# Patient Record
Sex: Female | Born: 1975 | Race: White | Hispanic: No | Smoking: Former smoker
Health system: Southern US, Community
[De-identification: ages and names within clinical notes are randomized; demographics above are authoritative.]

## PROBLEM LIST (undated history)

## (undated) DIAGNOSIS — K8020 Calculus of gallbladder without cholecystitis without obstruction: Secondary | ICD-10-CM

## (undated) DIAGNOSIS — C801 Malignant (primary) neoplasm, unspecified: Secondary | ICD-10-CM

## (undated) DIAGNOSIS — M1990 Unspecified osteoarthritis, unspecified site: Secondary | ICD-10-CM

## (undated) DIAGNOSIS — J45909 Unspecified asthma, uncomplicated: Secondary | ICD-10-CM

## (undated) DIAGNOSIS — K589 Irritable bowel syndrome without diarrhea: Secondary | ICD-10-CM

## (undated) DIAGNOSIS — S92909A Unspecified fracture of unspecified foot, initial encounter for closed fracture: Secondary | ICD-10-CM

## (undated) DIAGNOSIS — D6851 Activated protein C resistance: Secondary | ICD-10-CM

## (undated) DIAGNOSIS — D689 Coagulation defect, unspecified: Secondary | ICD-10-CM

## (undated) DIAGNOSIS — E669 Obesity, unspecified: Secondary | ICD-10-CM

## (undated) HISTORY — DX: Unspecified fracture of unspecified foot, initial encounter for closed fracture: S92.909A

## (undated) HISTORY — DX: Calculus of gallbladder without cholecystitis without obstruction: K80.20

## (undated) HISTORY — PX: TONSILLECTOMY: SUR1361

## (undated) HISTORY — DX: Coagulation defect, unspecified: D68.9

## (undated) HISTORY — DX: Unspecified osteoarthritis, unspecified site: M19.90

## (undated) HISTORY — DX: Activated protein C resistance: D68.51

## (undated) HISTORY — DX: Unspecified asthma, uncomplicated: J45.909

## (undated) HISTORY — DX: Irritable bowel syndrome, unspecified: K58.9

## (undated) HISTORY — PX: CHOLECYSTECTOMY: SHX55

## (undated) HISTORY — DX: Obesity, unspecified: E66.9

## (undated) HISTORY — DX: Malignant (primary) neoplasm, unspecified: C80.1

---

## 2007-01-22 ENCOUNTER — Observation Stay: Payer: Self-pay | Admitting: Vascular Surgery

## 2008-05-02 IMAGING — CT CT ABD-PELV W/ CM
1 of 2 series · 16 of 32 positions shown, 20 images · non-contrast
Comparison: none

REASON FOR EXAM: RLQ pain R/O appendicitis
COMMENTS:

PROCEDURE:     CT  - CT ABDOMEN / PELVIS  W  - January 22, 2007  [DATE]
RESULT:     Abdominopelvic scan was performed for RIGHT lower quadrant pain.
 Axial images were obtained status post intravenous and oral administration
of contrast material. The report was faxed to the Emergency Room.

[Series 2: appendicitis · axial · 0.79mm/px · z∈[-290,+166]mm · 16 of 166 slices shown, 20 images]
[im 7/166  soft-tissue]
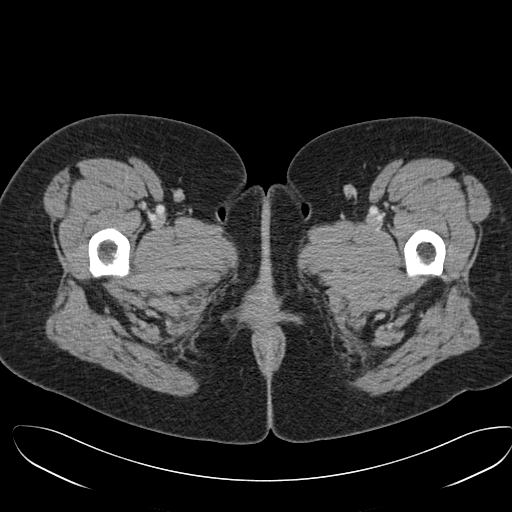
[im 7/166  bone]
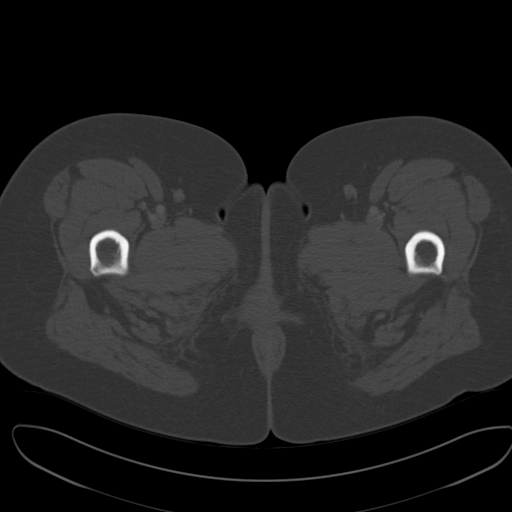
[im 21/166  soft-tissue]
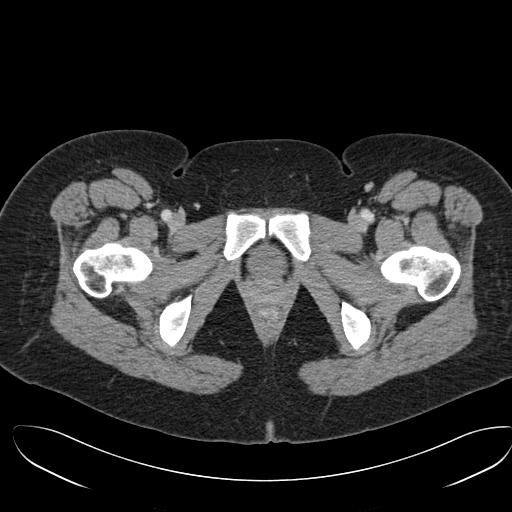
[im 35/166  soft-tissue]
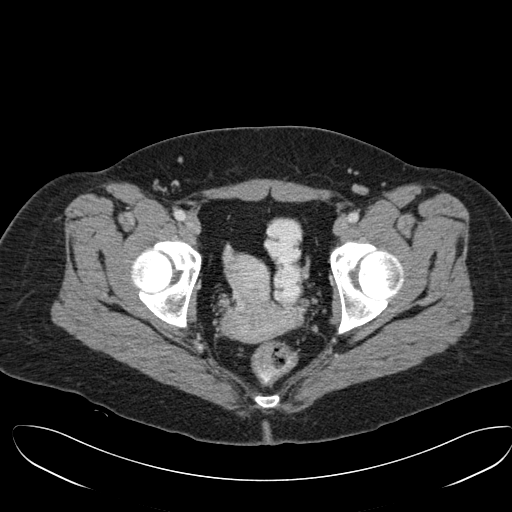
[im 42/166  soft-tissue]
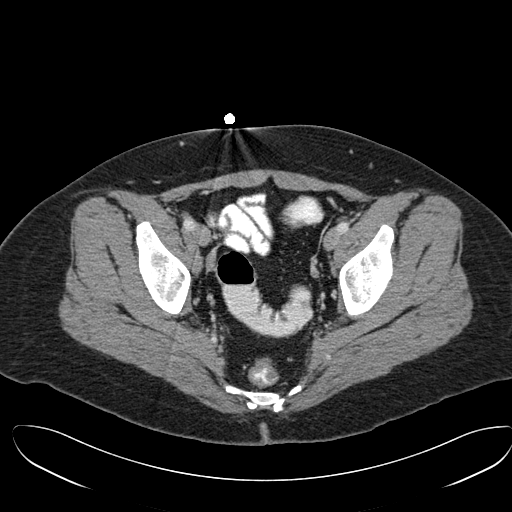
[im 56/166  soft-tissue]
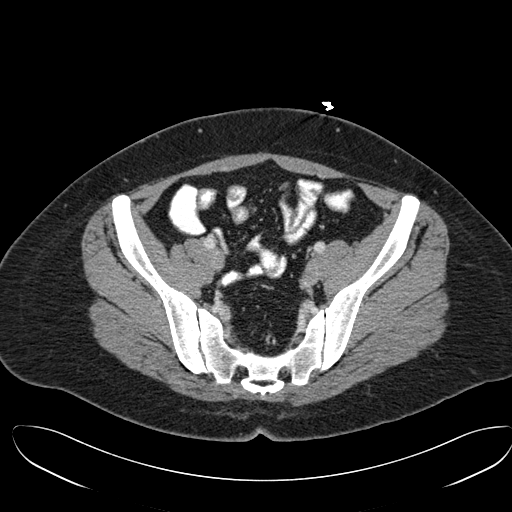
[im 69/166  soft-tissue]
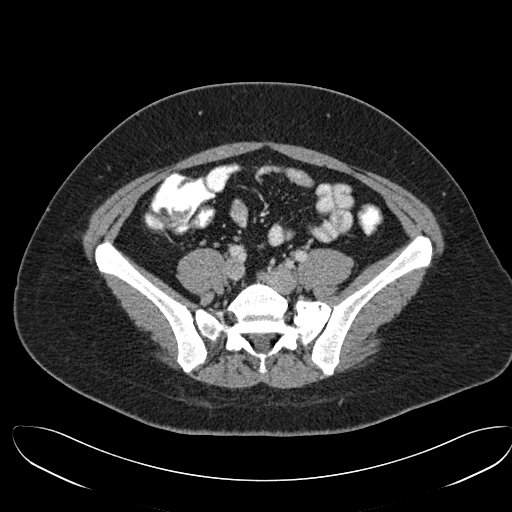
[im 76/166  soft-tissue]
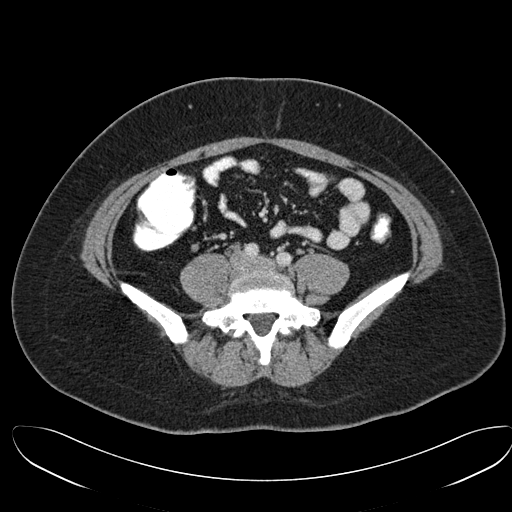
[im 90/166  soft-tissue]
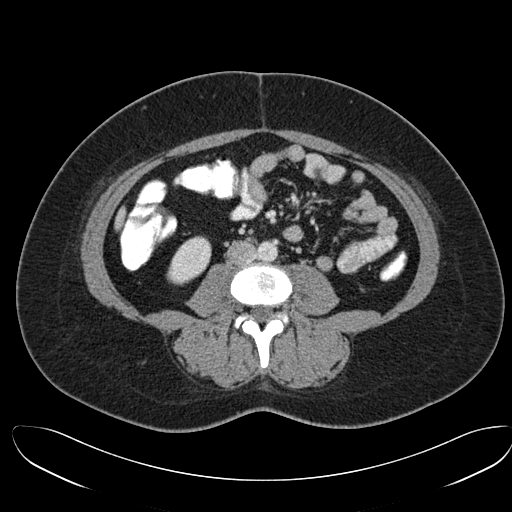
[im 97/166  soft-tissue]
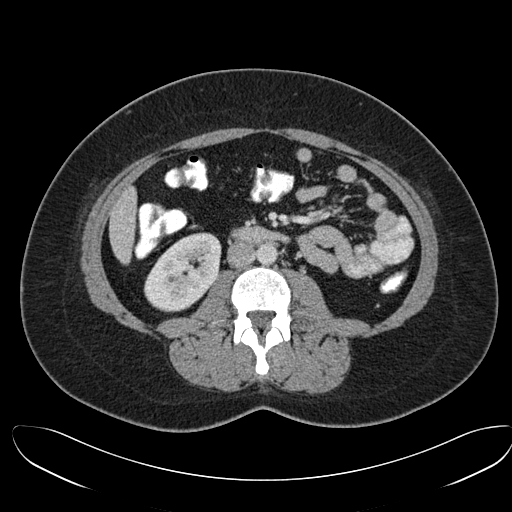
[im 97/166  bone]
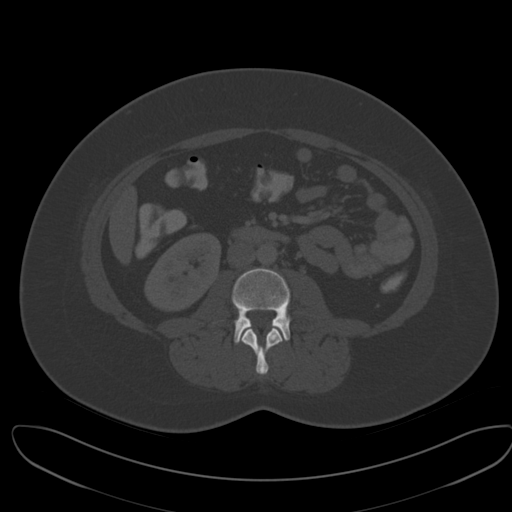
[im 111/166  soft-tissue]
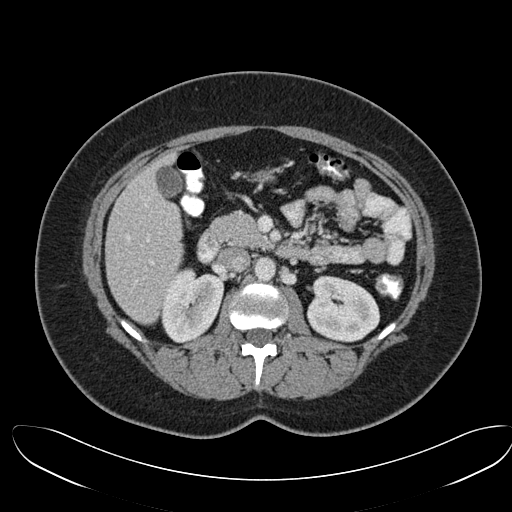
[im 124/166  soft-tissue]
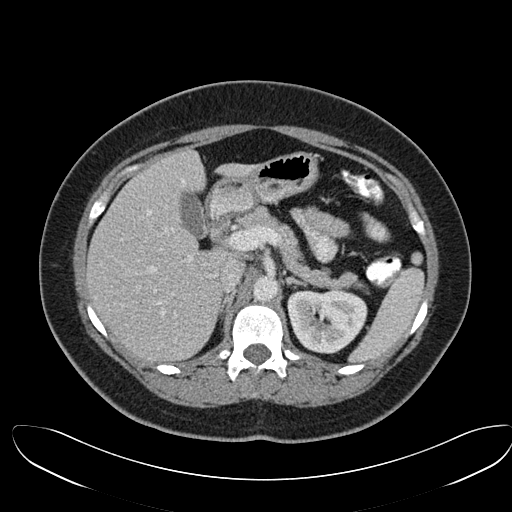
[im 131/166  soft-tissue]
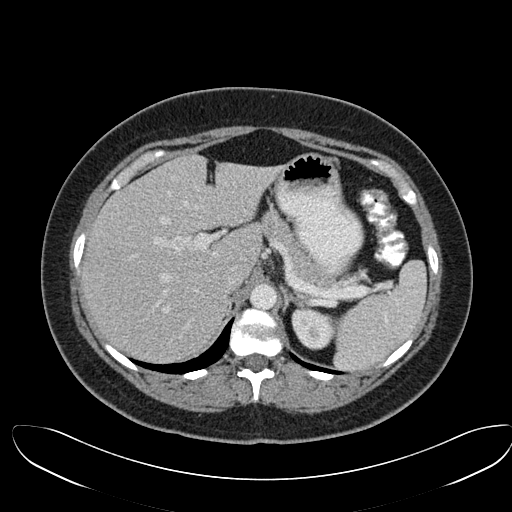
[im 138/166  lung]
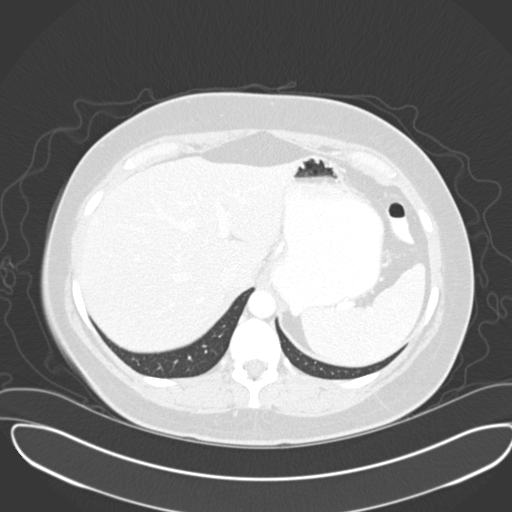
[im 145/166  soft-tissue]
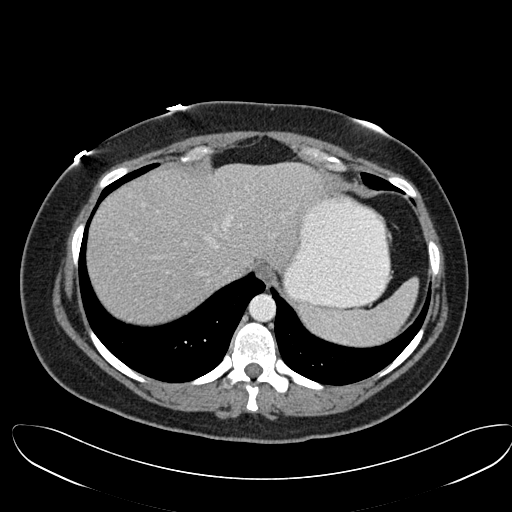
[im 145/166  lung]
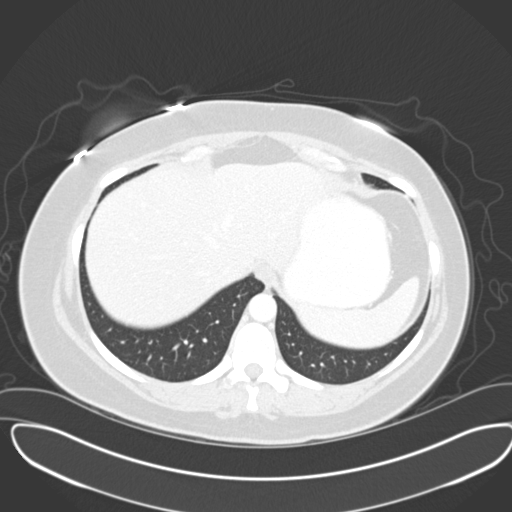
[im 152/166  lung]
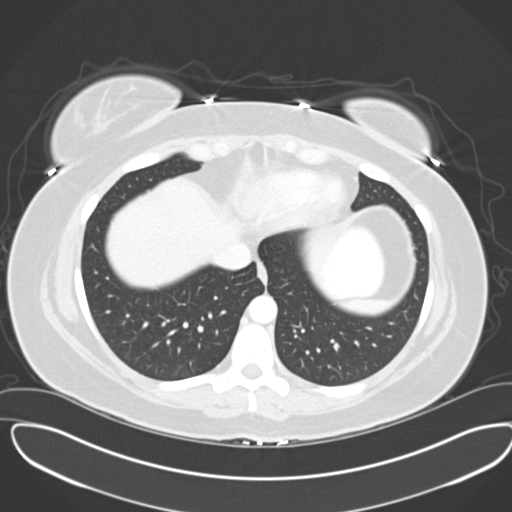
[im 159/166  soft-tissue]
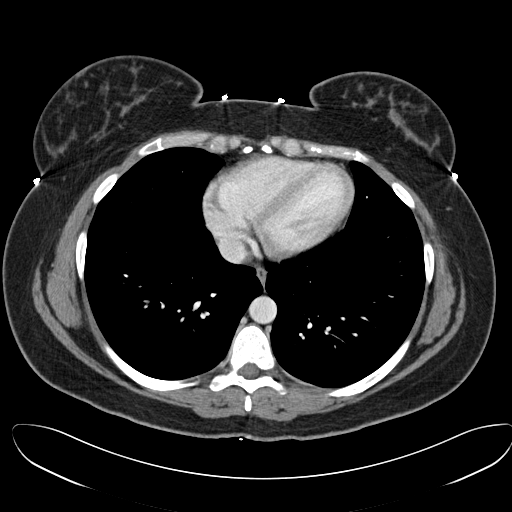
[im 159/166  lung]
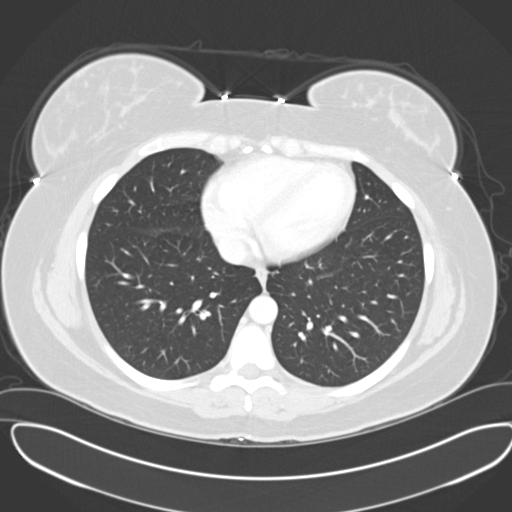

[16 of 32 positions shown; findings below may reference images not displayed]

FINDINGS: The liver and spleen appear intact. No ascites is seen. No
evidence of appendicitis is noted. No major organ abnormalities are noted.
The images through the lung bases reveal the lung bases to be clear with no
effusions.
IMPRESSION: 1)No definite evidence of appendicitis. No significant abnormalities are
noted on the abdominopelvic CT.

## 2010-02-04 ENCOUNTER — Encounter (INDEPENDENT_AMBULATORY_CARE_PROVIDER_SITE_OTHER): Payer: Self-pay | Admitting: *Deleted

## 2010-02-08 ENCOUNTER — Ambulatory Visit: Payer: Self-pay | Admitting: Internal Medicine

## 2010-02-17 ENCOUNTER — Ambulatory Visit: Payer: Self-pay | Admitting: General Surgery

## 2010-02-23 ENCOUNTER — Ambulatory Visit: Payer: Self-pay | Admitting: General Surgery

## 2010-11-07 ENCOUNTER — Encounter: Payer: Self-pay | Admitting: Maternal and Fetal Medicine

## 2010-11-24 ENCOUNTER — Encounter: Payer: Self-pay | Admitting: Maternal & Fetal Medicine

## 2011-01-17 NOTE — Letter (Signed)
Summary: New Patient letter  [facility]  [address] [place], [place] [address]   Phone: [phone]  Fax: [phone]       02/04/2010 MRN: [id number]  [facility] [address] [place], [place]  [address]  Dear Ms. Laurich,  Welcome to the Gastroenterology Division at [facility].    You are scheduled to see Dr. Arlyce Dice on 03-07-10 at 8:30a.m. on the 3rd floor at [facility], [address]  We ask that you try to arrive at our office 15 minutes prior to your appointment time to allow for check-in.  We would like you to complete the enclosed self-administered evaluation form prior to your visit and bring it with you on the day of your appointment.  We will review it with you.  Also, please bring a complete list of all your medications or, if you prefer, bring the medication bottles and we will list them.  Please bring your insurance card so that we may make a copy of it.  If your insurance requires a referral to see a specialist, please bring your referral form from your primary care physician.  Co-payments are due at the time of your visit and may be paid by cash, check or credit card.     Your office visit will consist of a consult with your physician (includes a physical exam), any laboratory testing he/she may order, scheduling of any necessary diagnostic testing (e.g. x-ray, ultrasound, CT-scan), and scheduling of a procedure (e.g. Endoscopy, Colonoscopy) if required.  Please allow enough time on your schedule to allow for any/all of these possibilities.    If you cannot keep your appointment, please call [phone] to cancel or reschedule prior to your appointment date.  This allows [place] the opportunity to schedule an appointment for another patient in need of care.  If you do not cancel or reschedule by 5 p.m. the business day prior to your appointment date, you will be charged a $50.00 late cancellation/no-show fee.    Thank you for choosing [facility] for your medical needs.  We appreciate the opportunity to care for you.  Please visit [place] at our website  to learn more about our practice.                     Sincerely,                                                             The Gastroenterology Division

## 2012-07-18 ENCOUNTER — Encounter: Payer: Self-pay | Admitting: Gastroenterology

## 2012-09-02 ENCOUNTER — Ambulatory Visit (INDEPENDENT_AMBULATORY_CARE_PROVIDER_SITE_OTHER): Payer: BC Managed Care – PPO | Admitting: Gastroenterology

## 2012-09-02 ENCOUNTER — Other Ambulatory Visit: Payer: Self-pay

## 2012-09-02 ENCOUNTER — Encounter: Payer: Self-pay | Admitting: Gastroenterology

## 2012-09-02 ENCOUNTER — Other Ambulatory Visit (INDEPENDENT_AMBULATORY_CARE_PROVIDER_SITE_OTHER): Payer: BC Managed Care – PPO

## 2012-09-02 VITALS — BP 110/80 | HR 72 | Ht 69.0 in | Wt 232.0 lb

## 2012-09-02 DIAGNOSIS — Z800 Family history of malignant neoplasm of digestive organs: Secondary | ICD-10-CM

## 2012-09-02 DIAGNOSIS — K625 Hemorrhage of anus and rectum: Secondary | ICD-10-CM

## 2012-09-02 DIAGNOSIS — R197 Diarrhea, unspecified: Secondary | ICD-10-CM

## 2012-09-02 LAB — CBC WITH DIFFERENTIAL/PLATELET
Basophils Absolute: 0 10*3/uL (ref 0.0–0.1)
Basophils Relative: 0.5 % (ref 0.0–3.0)
Eosinophils Absolute: 0.1 10*3/uL (ref 0.0–0.7)
HCT: 38.8 % (ref 36.0–46.0)
Lymphocytes Relative: 27.2 % (ref 12.0–46.0)
MCHC: 32.4 g/dL (ref 30.0–36.0)
Monocytes Relative: 7.9 % (ref 3.0–12.0)
Neutro Abs: 4.8 10*3/uL (ref 1.4–7.7)
Neutrophils Relative %: 63.7 % (ref 43.0–77.0)
RDW: 13.5 % (ref 11.5–14.6)
WBC: 7.5 10*3/uL (ref 4.5–10.5)

## 2012-09-02 LAB — COMPREHENSIVE METABOLIC PANEL
ALT: 15 U/L (ref 0–35)
Alkaline Phosphatase: 55 U/L (ref 39–117)
Glucose, Bld: 103 mg/dL — ABNORMAL HIGH (ref 70–99)
Sodium: 141 mEq/L (ref 135–145)
Total Bilirubin: 0.6 mg/dL (ref 0.3–1.2)
Total Protein: 7.3 g/dL (ref 6.0–8.3)

## 2012-09-02 MED ORDER — PEG-KCL-NACL-NASULF-NA ASC-C 100 G PO SOLR: 1.0000 | Freq: Once | ORAL | Status: DC

## 2012-09-02 NOTE — Progress Notes (Signed)
HPI: This is a  very pleasant 36 year old woman whom I am meeting for the first time today.   Father with rectal cancer, uncle same thing.  She also has terrible diarrhea, worse since GB removed 2 years ago.  Morning are the worst.  Normally has 3 loose stools a day, can be up to7-9 BMs a day.  Very rare solid.  Drinks one D coke.  Non etoh drinker. Years of this symptom.  Has gotten worse lately.  She has never tried imodium.  Overall stable weight.  Has   Review of systems: Pertinent positive and negative review of systems were noted in the above HPI section. Complete review of systems was performed and was otherwise normal.    Past Medical History  Diagnosis Date  . Arthritis   . Asthma     Childhood    Past Surgical History  Procedure Date  . Cholecystectomy     Current Outpatient Prescriptions  Medication Sig Dispense Refill  . diphenhydrAMINE (BENADRYL) 25 MG tablet Take 25 mg by mouth every 6 (six) hours as needed.      . etodolac (LODINE) 500 MG tablet Take 500 mg by mouth daily.      Lorita Officer Triphasic (TRI-SPRINTEC PO) Take 1 tablet by mouth daily.      . pantoprazole (PROTONIX) 20 MG tablet Take 20 mg by mouth daily.      . vitamin B-12 (CYANOCOBALAMIN) 500 MCG tablet Take 500 mcg by mouth daily.        Allergies as of 09/02/2012  . (No Known Allergies)    Family History  Problem Relation Age of Onset  . Rectal cancer Father   . Clotting disorder Father   . Rectal cancer Paternal Uncle   . Prostate cancer Paternal Uncle   . Clotting disorder Sister   . Crohn's disease Maternal Aunt   . Irritable bowel syndrome Maternal Aunt   . Diabetes Paternal Aunt   . Heart disease Maternal Grandfather     History   Social History  . Marital Status: Married    Spouse Name: N/A    Number of Children: N/A  . Years of Education: N/A   Occupational History  . Counselor    Social History Main Topics  . Smoking status: Former Games developer  .  Smokeless tobacco: Never Used  . Alcohol Use: No  . Drug Use: No  . Sexually Active: Not on file   Other Topics Concern  . Not on file   Social History Narrative  . No narrative on file       Physical Exam: BP 110/80  Pulse 72  Ht 5\' 9"  (1.753 m)  Wt 232 lb (105.235 kg)  BMI 34.26 kg/m2  LMP 08/19/2012 Constitutional: generally well-appearing Psychiatric: alert and oriented x3 Eyes: extraocular movements intact Mouth: oral pharynx moist, no lesions Neck: supple no lymphadenopathy Cardiovascular: heart regular rate and rhythm Lungs: clear to auscultation bilaterally Abdomen: soft, nontender, nondistended, no obvious ascites, no peritoneal signs, normal bowel sounds Extremities: no lower extremity edema bilaterally Skin: no lesions on visible extremities    Assessment and plan: 36 y.o. female with  chronic diarrhea, intermittent rectal bleeding, family history of rectal cancer  Should proceed to colonoscopy at her soonest convenience. I did not mention above but Crohn's disease also runs in her family. Her sister was here this morning as well with nearly identical symptoms and they would like procedures done on the same day. She is also going to have  basic blood work today including CBC, complete metabolic profile and celiac sprue testing. I also recommended a trial of scheduled Imodium every morning. She has never tried Imodium before.

## 2012-09-02 NOTE — Patient Instructions (Addendum)
You will have labs checked today in the basement lab.  Please head down after you check out with the front desk  (cbc, cmet, Celiac panel). You will be set up for a colonoscopy for FH of rectal cancer, diarrhea, minor bleeding. Take one OTC imodium once daily, every morning. Try OTC prilosec, prevacid, protonix; or their generic equivalent.  Best taken 20-30 minutes before a meal.

## 2012-09-03 LAB — CELIAC PANEL 10
Gliadin IgA: 2.2 U/mL (ref <20)
Gliadin IgG: 4.3 U/mL (ref <20)
Tissue Transglut Ab: 5.6 U/mL (ref <20)
Tissue Transglutaminase Ab, IgA: 2.3 U/mL (ref <20)

## 2012-10-09 ENCOUNTER — Ambulatory Visit (AMBULATORY_SURGERY_CENTER): Payer: BC Managed Care – PPO | Admitting: Gastroenterology

## 2012-10-09 ENCOUNTER — Encounter: Payer: Self-pay | Admitting: Gastroenterology

## 2012-10-09 VITALS — BP 121/86 | HR 79 | Temp 98.9°F | Resp 23 | Ht 69.0 in | Wt 232.0 lb

## 2012-10-09 DIAGNOSIS — Z800 Family history of malignant neoplasm of digestive organs: Secondary | ICD-10-CM

## 2012-10-09 DIAGNOSIS — R197 Diarrhea, unspecified: Secondary | ICD-10-CM

## 2012-10-09 DIAGNOSIS — D126 Benign neoplasm of colon, unspecified: Secondary | ICD-10-CM

## 2012-10-09 MED ORDER — SODIUM CHLORIDE 0.9 % IV SOLN: 500.0000 mL | INTRAVENOUS | Status: DC

## 2012-10-09 NOTE — Patient Instructions (Addendum)

## 2012-10-09 NOTE — Op Note (Signed)
[facility] [address] [place], [address]   COLONOSCOPY PROCEDURE REPORT  PATIENT: Bianca Lee, Bianca Lee  MR#: [id number] BIRTHDATE: 26-Mar-1976 , 36  yrs. old GENDER: Female ENDOSCOPIST: Rachael Fee, MD REFERRED NW:GNFAO Arlana Pouch, M.D. PROCEDURE DATE:  10/09/2012 PROCEDURE:   Colonoscopy with biopsy ASA CLASS:   Class II INDICATIONS:elevated risk screening and chronic diarrhea. MEDICATIONS: Fentanyl 100 mcg IV, Versed 10 mg IV, and These medications were titrated to patient response per physician's verbal order  DESCRIPTION OF PROCEDURE:   After the risks benefits and alternatives of the procedure were thoroughly explained, informed consent was obtained.  A digital rectal exam revealed no abnormalities of the rectum.   The LB [id number]-[id number]  endoscope was introduced through the anus and advanced to the terminal ileum which was intubated for a short distance. No adverse events experienced.   The quality of the prep was good, using MoviPrep The instrument was then slowly withdrawn as the colon was fully examined.  COLON FINDINGS: The mucosa appeared normal in the terminal ileum. A normal appearing cecum, ileocecal valve, and appendiceal orifice were identified.  The ascending, hepatic flexure, transverse, splenic flexure, descending, sigmoid colon and rectum appeared unremarkable.  No polyps or cancers were seen.  Random colon biopsies were taken and sent to pathology.  Retroflexed views revealed no abnormalities. The time to cecum=2 minutes 40 seconds. Withdrawal time=6 minutes 58 seconds.  The scope was withdrawn and the procedure completed. COMPLICATIONS: There were no complications.  ENDOSCOPIC IMPRESSION: 1.   Normal mucosa in the terminal ileum 2.   Normal colon; randomly biopsied to check for microscopic colitis  RECOMMENDATIONS: Given your significant family history of colon cancer, you should have a repeat colonoscopy in 5 years Await final  biopsies, for now continue to take imodium once daily (seems to be really helping)  eSigned:  Rachael Fee, MD 10/09/2012 2:22 PM

## 2012-10-09 NOTE — Progress Notes (Signed)
Patient did not experience any of the following events: a burn prior to discharge; a fall within the facility; wrong site/side/patient/procedure/implant event; or a hospital transfer or hospital admission upon discharge from the facility. (G8907) Patient did not have preoperative order for IV antibiotic SSI prophylaxis. (G8918)  

## 2012-10-10 ENCOUNTER — Telehealth: Payer: Self-pay | Admitting: *Deleted

## 2012-10-10 NOTE — Telephone Encounter (Signed)
  Follow up Call-  Call back number 10/09/2012  Post procedure Call Back phone  # [phone]  Permission to leave phone message Yes     Patient questions:  Message left to call us if necessary.

## 2012-10-18 ENCOUNTER — Encounter: Payer: Self-pay | Admitting: Gastroenterology

## 2013-12-24 ENCOUNTER — Encounter: Payer: Self-pay | Admitting: Obstetrics and Gynecology

## 2013-12-24 LAB — US OB COMP LESS 14 WKS

## 2013-12-26 LAB — OB RESULTS CONSOLE RUBELLA ANTIBODY, IGM: Rubella: IMMUNE

## 2013-12-26 LAB — OB RESULTS CONSOLE ABO/RH: RH Type: NEGATIVE

## 2013-12-26 LAB — OB RESULTS CONSOLE HEPATITIS B SURFACE ANTIGEN: Hepatitis B Surface Ag: NEGATIVE

## 2013-12-26 LAB — OB RESULTS CONSOLE ANTIBODY SCREEN: ANTIBODY SCREEN: NEGATIVE

## 2013-12-26 LAB — OB RESULTS CONSOLE GC/CHLAMYDIA
Chlamydia: NEGATIVE
Gonorrhea: NEGATIVE

## 2013-12-26 LAB — OB RESULTS CONSOLE HIV ANTIBODY (ROUTINE TESTING): HIV: NONREACTIVE

## 2013-12-27 ENCOUNTER — Encounter: Payer: Self-pay | Admitting: Obstetrics and Gynecology

## 2014-01-13 ENCOUNTER — Encounter (HOSPITAL_COMMUNITY): Payer: Self-pay

## 2014-01-13 ENCOUNTER — Ambulatory Visit (HOSPITAL_COMMUNITY)
Admission: RE | Admit: 2014-01-13 | Discharge: 2014-01-13 | Disposition: A | Discharge status: Home / Self Care | Payer: BC Managed Care – PPO | Source: Ambulatory Visit | Attending: Obstetrics and Gynecology | Admitting: Obstetrics and Gynecology

## 2014-01-13 DIAGNOSIS — D689 Coagulation defect, unspecified: Secondary | ICD-10-CM | POA: Insufficient documentation

## 2014-01-13 DIAGNOSIS — D6851 Activated protein C resistance: Secondary | ICD-10-CM

## 2014-01-13 DIAGNOSIS — O09519 Supervision of elderly primigravida, unspecified trimester: Secondary | ICD-10-CM | POA: Insufficient documentation

## 2014-01-13 DIAGNOSIS — D6859 Other primary thrombophilia: Secondary | ICD-10-CM | POA: Insufficient documentation

## 2014-01-13 DIAGNOSIS — R51 Headache: Secondary | ICD-10-CM | POA: Insufficient documentation

## 2014-01-13 DIAGNOSIS — O99119 Other diseases of the blood and blood-forming organs and certain disorders involving the immune mechanism complicating pregnancy, unspecified trimester: Principal | ICD-10-CM

## 2014-01-13 DIAGNOSIS — IMO0002 Reserved for concepts with insufficient information to code with codable children: Secondary | ICD-10-CM

## 2014-01-13 NOTE — Consult Note (Addendum)
MFM consult  HPI: 38 yr old G1P0 at [redacted]w[redacted]d with heterozygous factor V leiden referred by Dr. Gaetano Net for consult. Patient reports pregnancy has been uncomplicated to date. Has a headache today and has had some nausea.  PMH: heterozygous factor V leiden mutation PSH: cholecystectomy, tonsillectomy Past Ob hx: none Meds: diclegis, PNV, zantac Allergies: preservative in flu vaccine Social hx: negative Family history: father with homozygous factor V leiden mutation; has had multiple thromboembolic events; husband had a heart transplant- due to cardiomyopathy thought to be as a result of a viral illness  I counseled the patient as follows: 1. Heterozygous factor V leiden: I discussed the increased risk of a thromboembolic event; although given no personal history of a thromboembolic event the overall risk is low. Discussed data is mixed regarding increased risk of other complications but most data suggests no significant increase in risk of fetal growth restriction or preeclampsia. There may be a slight increase in risk of fetal loss. Discussed this is considered a low risk thrombophilia and no anticoagulation is recommended during pregnancy; however, given family history of thromboembolic event I recommend 6 weeks of thromboprophylaxis postpartum (can use lovenox 40mg /day). I also recommend starting thromboprophylaxis if other risk factors develop (i.e. Bed rest) during pregnancy. I recommend the use of sequential compression devices during labor and immediately postpartum until lovenox is started. Recommend starting lovenox 6 hours after a vaginal delivery or 12 hours after a C section. I recommend close surveillance for the development of signs/symptoms of thrombosis during pregnancy and postpartum- discussed symptoms with the patient. Also discussed if patient will travel to take precautions (adequate hydration, leg exercises, frequent stops to walk, use of TED hose).  Would recommend assessment of  fetal growth one time in the third trimester. Of note patient was also screened for antithrombin III, protein C and S deficiencies, and antiphospholipid antibodies- all of which were negative. I do not see that the patient was screened for prothrombin gene mutation and you can consider screening for this mutation.  2. Recommend fetal anatomic survey at 18-[redacted] weeks gestation. 3. Advanced maternal age: - discussed association with increased risk of fetal aneuploidy. Discussed options for testing- amniocentesis and CVS and the associated risks. Discussed options for screening (first trimester screen, quad screen, and cell free fetal DNA) and the limitations of each test in detecting fetal aneuploidy.  - patient has decided on cell free fetal DNA which will be done with her primary OB 4. Recommend maternal serum AFP at 15-[redacted] weeks gestation. 5. Husband with heart transplant: - discussed if not due to a congenital heart defect there is likely no increased risk to the fetus- patient reports her husband's physicians believe this was due to viral illness - patient concerned about medications her husband is on (does not have full list but reports is on immunosuppressants)- discussed there is likely no increased risk to the fetus from her husband's immunosuppressants. 6. Headache: - discussed patient may take tylenol but do not exceed 2g/day - may also use small amount of caffeine sparingly  I spent a total of 30 minutes with the patient of which 100% was in face to face consultation discussing the above.  [place], MD

## 2014-01-14 NOTE — Addendum Note (Signed)
Encounter addended by: Clarene Duke, RN on: 01/14/2014 11:51 AM<BR>     Documentation filed: Charges VN

## 2014-02-14 ENCOUNTER — Inpatient Hospital Stay (HOSPITAL_COMMUNITY)
Admission: AD | Admit: 2014-02-14 | Payer: BC Managed Care – PPO | Source: Ambulatory Visit | Admitting: Obstetrics and Gynecology

## 2014-06-08 ENCOUNTER — Encounter (HOSPITAL_COMMUNITY): Payer: Self-pay

## 2014-07-02 LAB — OB RESULTS CONSOLE GBS: GBS: POSITIVE

## 2014-07-24 ENCOUNTER — Telehealth (HOSPITAL_COMMUNITY): Payer: Self-pay | Admitting: *Deleted

## 2014-07-24 ENCOUNTER — Encounter (HOSPITAL_COMMUNITY): Payer: Self-pay | Admitting: *Deleted

## 2014-07-24 NOTE — Telephone Encounter (Signed)
Preadmission screen  

## 2014-07-30 ENCOUNTER — Encounter (HOSPITAL_COMMUNITY): Payer: Self-pay

## 2014-07-30 ENCOUNTER — Inpatient Hospital Stay (HOSPITAL_COMMUNITY)
Admission: RE | Admit: 2014-07-30 | Discharge: 2014-08-05 | DRG: 765 | Disposition: A | Discharge status: Home / Self Care | Payer: BC Managed Care – PPO | Source: Ambulatory Visit | Attending: Obstetrics and Gynecology | Admitting: Obstetrics and Gynecology

## 2014-07-30 DIAGNOSIS — E669 Obesity, unspecified: Secondary | ICD-10-CM | POA: Diagnosis present

## 2014-07-30 DIAGNOSIS — Z85828 Personal history of other malignant neoplasm of skin: Secondary | ICD-10-CM | POA: Diagnosis not present

## 2014-07-30 DIAGNOSIS — D649 Anemia, unspecified: Secondary | ICD-10-CM | POA: Diagnosis present

## 2014-07-30 DIAGNOSIS — Z833 Family history of diabetes mellitus: Secondary | ICD-10-CM

## 2014-07-30 DIAGNOSIS — Z87891 Personal history of nicotine dependence: Secondary | ICD-10-CM | POA: Diagnosis not present

## 2014-07-30 DIAGNOSIS — O09519 Supervision of elderly primigravida, unspecified trimester: Secondary | ICD-10-CM | POA: Diagnosis present

## 2014-07-30 DIAGNOSIS — D689 Coagulation defect, unspecified: Secondary | ICD-10-CM | POA: Diagnosis present

## 2014-07-30 DIAGNOSIS — O324XX0 Maternal care for high head at term, not applicable or unspecified: Secondary | ICD-10-CM | POA: Diagnosis present

## 2014-07-30 DIAGNOSIS — O9912 Other diseases of the blood and blood-forming organs and certain disorders involving the immune mechanism complicating childbirth: Secondary | ICD-10-CM | POA: Diagnosis present

## 2014-07-30 DIAGNOSIS — O99892 Other specified diseases and conditions complicating childbirth: Secondary | ICD-10-CM | POA: Diagnosis present

## 2014-07-30 DIAGNOSIS — Z800 Family history of malignant neoplasm of digestive organs: Secondary | ICD-10-CM

## 2014-07-30 DIAGNOSIS — Z22330 Carrier of Group B streptococcus: Secondary | ICD-10-CM | POA: Diagnosis not present

## 2014-07-30 DIAGNOSIS — O9989 Other specified diseases and conditions complicating pregnancy, childbirth and the puerperium: Secondary | ICD-10-CM

## 2014-07-30 DIAGNOSIS — D6859 Other primary thrombophilia: Secondary | ICD-10-CM | POA: Diagnosis present

## 2014-07-30 DIAGNOSIS — O99214 Obesity complicating childbirth: Secondary | ICD-10-CM

## 2014-07-30 DIAGNOSIS — Z6837 Body mass index (BMI) 37.0-37.9, adult: Secondary | ICD-10-CM | POA: Diagnosis not present

## 2014-07-30 DIAGNOSIS — O3660X0 Maternal care for excessive fetal growth, unspecified trimester, not applicable or unspecified: Secondary | ICD-10-CM | POA: Diagnosis present

## 2014-07-30 DIAGNOSIS — O9902 Anemia complicating childbirth: Secondary | ICD-10-CM | POA: Diagnosis present

## 2014-07-30 LAB — CBC
HEMATOCRIT: 38.7 % (ref 36.0–46.0)
Hemoglobin: 13.2 g/dL (ref 12.0–15.0)
MCH: 29.5 pg (ref 26.0–34.0)
MCHC: 34.1 g/dL (ref 30.0–36.0)
MCV: 86.6 fL (ref 78.0–100.0)
Platelets: 243 10*3/uL (ref 150–400)
RBC: 4.47 MIL/uL (ref 3.87–5.11)
RDW: 14.9 % (ref 11.5–15.5)
WBC: 10.8 10*3/uL — AB (ref 4.0–10.5)

## 2014-07-30 MED ORDER — OXYTOCIN BOLUS FROM INFUSION: 500.0000 mL | INTRAVENOUS | Status: DC

## 2014-07-30 MED ORDER — ACETAMINOPHEN 325 MG PO TABS
650.0000 mg | ORAL_TABLET | ORAL | Status: DC | PRN
Start: 2014-07-30 — End: 2014-08-01

## 2014-07-30 MED ORDER — TERBUTALINE SULFATE 1 MG/ML IJ SOLN: 0.2500 mg | Freq: Once | INTRAMUSCULAR | Status: AC | PRN

## 2014-07-30 MED ORDER — LACTATED RINGERS IV SOLN
INTRAVENOUS | Status: DC
  Administered 2014-07-30 – 2014-08-01 (×5): via INTRAVENOUS

## 2014-07-30 MED ORDER — ZOLPIDEM TARTRATE 5 MG PO TABS
5.0000 mg | ORAL_TABLET | Freq: Every evening | ORAL | Status: DC | PRN
  Administered 2014-07-30 – 2014-07-31 (×2): 5 mg via ORAL
  Filled 2014-07-30 (×2): qty 1

## 2014-07-30 MED ORDER — LACTATED RINGERS IV SOLN: 500.0000 mL | INTRAVENOUS | Status: DC | PRN

## 2014-07-30 MED ORDER — ONDANSETRON HCL 4 MG/2ML IJ SOLN: 4.0000 mg | Freq: Four times a day (QID) | INTRAMUSCULAR | Status: DC | PRN

## 2014-07-30 MED ORDER — PENICILLIN G POTASSIUM 5000000 UNITS IJ SOLR
5.0000 10*6.[IU] | Freq: Once | INTRAMUSCULAR | Status: AC
  Administered 2014-07-31: 5 10*6.[IU] via INTRAVENOUS
  Filled 2014-07-30: qty 5

## 2014-07-30 MED ORDER — CITRIC ACID-SODIUM CITRATE 334-500 MG/5ML PO SOLN
30.0000 mL | ORAL | Status: DC | PRN
  Administered 2014-08-01: 30 mL via ORAL
  Filled 2014-07-30 (×2): qty 15

## 2014-07-30 MED ORDER — IBUPROFEN 600 MG PO TABS: 600.0000 mg | ORAL_TABLET | Freq: Four times a day (QID) | ORAL | Status: DC | PRN

## 2014-07-30 MED ORDER — OXYTOCIN 40 UNITS IN LACTATED RINGERS INFUSION - SIMPLE MED
1.0000 m[IU]/min | INTRAVENOUS | Status: DC
  Administered 2014-07-31: 2 m[IU]/min via INTRAVENOUS
  Administered 2014-08-01: 4 m[IU]/min via INTRAVENOUS
  Administered 2014-08-01: 1 m[IU]/min via INTRAVENOUS
  Administered 2014-08-01: 2 m[IU]/min via INTRAVENOUS
  Administered 2014-08-01: 4 m[IU]/min via INTRAVENOUS
  Filled 2014-07-30 (×2): qty 1000

## 2014-07-30 MED ORDER — LIDOCAINE HCL (PF) 1 % IJ SOLN
30.0000 mL | INTRAMUSCULAR | Status: DC | PRN
  Filled 2014-07-30: qty 30

## 2014-07-30 MED ORDER — OXYTOCIN 40 UNITS IN LACTATED RINGERS INFUSION - SIMPLE MED: 62.5000 mL/h | INTRAVENOUS | Status: DC

## 2014-07-30 MED ORDER — MISOPROSTOL 25 MCG QUARTER TABLET
25.0000 ug | ORAL_TABLET | ORAL | Status: AC
  Administered 2014-07-30 – 2014-07-31 (×2): 25 ug via VAGINAL
  Filled 2014-07-30 (×2): qty 0.25

## 2014-07-30 MED ORDER — PENICILLIN G POTASSIUM 5000000 UNITS IJ SOLR
2.5000 10*6.[IU] | INTRAMUSCULAR | Status: DC
  Administered 2014-07-31 – 2014-08-01 (×9): 2.5 10*6.[IU] via INTRAVENOUS
  Filled 2014-07-30 (×11): qty 2.5

## 2014-07-31 LAB — RPR

## 2014-07-31 MED ORDER — MISOPROSTOL 25 MCG QUARTER TABLET
25.0000 ug | ORAL_TABLET | ORAL | Status: DC
  Administered 2014-07-31 – 2014-08-01 (×2): 25 ug via VAGINAL
  Filled 2014-07-31: qty 0.25
  Filled 2014-07-31 (×3): qty 1
  Filled 2014-07-31: qty 0.25
  Filled 2014-07-31: qty 1

## 2014-07-31 NOTE — Progress Notes (Signed)
FHT reactive UCs q3-4 min Cx 2/40/-3/vtx Patient says UCs mild  D/W patient and husband above. Will continue pitocin per protocol about 2 hours. If no change, will stop pitocin and repeat cytotec induction. All questions answered.

## 2014-07-31 NOTE — H&P (Signed)
Bianca Lee is a 38 y.o. female presenting for IOL secondary to EFW of 8# 12 oz about 1 week ago. Pregnancy complicated by heterozygous Factor V. MFM consult done and plan is for PP Lovenox 40mg /day for 6 weeks. Denies HA/vision change, no epigastric pain. No ROM. Also AMA with normal free fetal DNA testing. Maternal Medical History:  Fetal activity: Perceived fetal activity is normal.      OB History   Grav Para Term Preterm Abortions TAB SAB Ect Mult Living   1              Past Medical History  Diagnosis Date  . Arthritis   . Asthma     Childhood  . Factor 5 Leiden mutation, heterozygous   . Cancer     basal cell carcinoma   Past Surgical History  Procedure Laterality Date  . Cholecystectomy    . Tonsillectomy     Family History: family history includes Cancer in her father, maternal grandmother, mother, paternal aunt, and paternal uncle; Clotting disorder in her father and sister; Crohn's disease in her maternal aunt; Diabetes in her paternal aunt; Heart disease in her maternal grandfather; Irritable bowel syndrome in her maternal aunt; Prostate cancer in her paternal uncle; Rectal cancer in her father and paternal uncle. Social History:  reports that she has quit smoking. Her smoking use included Cigarettes. She smoked 0.00 packs per day. She has never used smokeless tobacco. She reports that she does not drink alcohol or use illicit drugs.   Prenatal Transfer Tool  Maternal Diabetes: No Genetic Screening: Normal Maternal Ultrasounds/Referrals: Normal Fetal Ultrasounds or other Referrals:  None Maternal Substance Abuse:  No Significant Maternal Medications:  None Significant Maternal Lab Results:  None Other Comments:  None  Review of Systems  Eyes: Negative for blurred vision.  Gastrointestinal: Negative for abdominal pain.  Neurological: Negative for headaches.    Dilation: 1.5 Effacement (%): 60 Station: -3 Exam by:: Orpah Melter, RN Blood pressure 96/65,  pulse 66, temperature 97.8 F (36.6 C), temperature source Oral, resp. rate 18, height 5\' 9"  (1.753 m), weight 113.399 kg (250 lb), last menstrual period 10/20/2013, SpO2 100.00%.   Fetal Exam Fetal State Assessment: Category I - tracings are normal.     Physical Exam  Cardiovascular: Normal rate and regular rhythm.   Respiratory: Effort normal.  GI: Soft. There is no tenderness.  Neurological: She has normal reflexes.    Prenatal labs: ABO, Rh: --/--/A NEG (08/13 2015) Antibody: POS (08/13 2015) Rubella: Immune (01/09 0000) RPR: NON REAC (08/13 2015)  HBsAg: Negative (01/09 0000)  HIV: Non-reactive (01/09 0000)  GBS: Positive (07/16 0000)   Assessment/Plan: 38 yo G1P0 with LGA EFW for two stage induction S/P cytotec x 2 last pm and now pitocin infusing D/W patient and husband two stage induction and risks   Griffen Frayne II,Keri Veale E 07/31/2014, 8:12 AM

## 2014-07-31 NOTE — Progress Notes (Signed)
D/W nurse-cx unchanged Will stop Pitocin Ambulate, shower, etc Repeat cytotec induction this pm with pitocin in am

## 2014-08-01 ENCOUNTER — Encounter (HOSPITAL_COMMUNITY)
Admission: RE | Disposition: A | Discharge status: Home / Self Care | Payer: Self-pay | Source: Ambulatory Visit | Attending: Obstetrics and Gynecology

## 2014-08-01 ENCOUNTER — Encounter (HOSPITAL_COMMUNITY): Payer: BC Managed Care – PPO | Admitting: Anesthesiology

## 2014-08-01 ENCOUNTER — Inpatient Hospital Stay (HOSPITAL_COMMUNITY): Payer: BC Managed Care – PPO | Admitting: Anesthesiology

## 2014-08-01 ENCOUNTER — Encounter (HOSPITAL_COMMUNITY): Payer: Self-pay

## 2014-08-01 SURGERY — Surgical Case
Anesthesia: Epidural

## 2014-08-01 MED ORDER — NALOXONE HCL 0.4 MG/ML IJ SOLN: 0.4000 mg | INTRAMUSCULAR | Status: DC | PRN

## 2014-08-01 MED ORDER — DIPHENHYDRAMINE HCL 12.5 MG/5ML PO ELIX
12.5000 mg | ORAL_SOLUTION | Freq: Four times a day (QID) | ORAL | Status: DC | PRN
  Filled 2014-08-01: qty 5

## 2014-08-01 MED ORDER — SODIUM CHLORIDE 0.9 % IJ SOLN: 9.0000 mL | INTRAMUSCULAR | Status: DC | PRN

## 2014-08-01 MED ORDER — SODIUM BICARBONATE 8.4 % IV SOLN
INTRAVENOUS | Status: DC | PRN
  Administered 2014-08-01 (×3): 5 mL via EPIDURAL

## 2014-08-01 MED ORDER — LACTATED RINGERS IV SOLN
500.0000 mL | Freq: Once | INTRAVENOUS | Status: AC
  Administered 2014-08-01: 500 mL via INTRAVENOUS

## 2014-08-01 MED ORDER — WITCH HAZEL-GLYCERIN EX PADS: 1.0000 "application " | MEDICATED_PAD | CUTANEOUS | Status: DC | PRN

## 2014-08-01 MED ORDER — PRENATAL MULTIVITAMIN CH
1.0000 | ORAL_TABLET | Freq: Every day | ORAL | Status: DC
  Administered 2014-08-02 – 2014-08-04 (×3): 1 via ORAL
  Filled 2014-08-01 (×3): qty 1

## 2014-08-01 MED ORDER — MENTHOL 3 MG MT LOZG: 1.0000 | LOZENGE | OROMUCOSAL | Status: DC | PRN

## 2014-08-01 MED ORDER — ONDANSETRON HCL 4 MG/2ML IJ SOLN: 4.0000 mg | Freq: Three times a day (TID) | INTRAMUSCULAR | Status: DC | PRN

## 2014-08-01 MED ORDER — DEXAMETHASONE SODIUM PHOSPHATE 4 MG/ML IJ SOLN
INTRAMUSCULAR | Status: DC | PRN
  Administered 2014-08-01: 5 mg via INTRAVENOUS

## 2014-08-01 MED ORDER — LACTATED RINGERS IV SOLN
INTRAVENOUS | Status: DC
  Administered 2014-08-02: 07:00:00 via INTRAVENOUS

## 2014-08-01 MED ORDER — NALBUPHINE HCL 10 MG/ML IJ SOLN: 5.0000 mg | INTRAMUSCULAR | Status: DC | PRN

## 2014-08-01 MED ORDER — SIMETHICONE 80 MG PO CHEW
80.0000 mg | CHEWABLE_TABLET | Freq: Three times a day (TID) | ORAL | Status: DC
  Administered 2014-08-02 – 2014-08-05 (×7): 80 mg via ORAL
  Filled 2014-08-01 (×8): qty 1

## 2014-08-01 MED ORDER — MIDAZOLAM HCL 2 MG/2ML IJ SOLN: 0.5000 mg | Freq: Once | INTRAMUSCULAR | Status: DC | PRN

## 2014-08-01 MED ORDER — BUPIVACAINE LIPOSOME 1.3 % IJ SUSP
20.0000 mL | Freq: Once | INTRAMUSCULAR | Status: DC
  Filled 2014-08-01: qty 20

## 2014-08-01 MED ORDER — BUTORPHANOL TARTRATE 1 MG/ML IJ SOLN
1.0000 mg | INTRAMUSCULAR | Status: DC | PRN
  Administered 2014-08-01 (×2): 1 mg via INTRAVENOUS
  Filled 2014-08-01 (×2): qty 1

## 2014-08-01 MED ORDER — OXYCODONE-ACETAMINOPHEN 5-325 MG PO TABS
1.0000 | ORAL_TABLET | ORAL | Status: DC | PRN
  Administered 2014-08-02 (×2): 1 via ORAL
  Administered 2014-08-03: 2 via ORAL
  Administered 2014-08-03 – 2014-08-05 (×7): 1 via ORAL
  Filled 2014-08-01 (×10): qty 1
  Filled 2014-08-01: qty 2
  Filled 2014-08-01: qty 1

## 2014-08-01 MED ORDER — HYDROMORPHONE 0.3 MG/ML IV SOLN
INTRAVENOUS | Status: DC
  Administered 2014-08-02: 1.33 mg via INTRAVENOUS
  Administered 2014-08-02: 01:00:00 via INTRAVENOUS
  Administered 2014-08-02: 3.33 mg via INTRAVENOUS
  Administered 2014-08-02: 0.67 mg via INTRAVENOUS
  Filled 2014-08-01: qty 25

## 2014-08-01 MED ORDER — ONDANSETRON HCL 4 MG/2ML IJ SOLN: 4.0000 mg | INTRAMUSCULAR | Status: DC | PRN

## 2014-08-01 MED ORDER — DIPHENHYDRAMINE HCL 50 MG/ML IJ SOLN: 12.5000 mg | Freq: Four times a day (QID) | INTRAMUSCULAR | Status: DC | PRN

## 2014-08-01 MED ORDER — FENTANYL 2.5 MCG/ML BUPIVACAINE 1/10 % EPIDURAL INFUSION (WH - ANES)
14.0000 mL/h | INTRAMUSCULAR | Status: DC | PRN
  Filled 2014-08-01 (×2): qty 125

## 2014-08-01 MED ORDER — SENNOSIDES-DOCUSATE SODIUM 8.6-50 MG PO TABS
2.0000 | ORAL_TABLET | ORAL | Status: DC
  Administered 2014-08-04: 1 via ORAL
  Filled 2014-08-01 (×2): qty 2
  Filled 2014-08-01: qty 1
  Filled 2014-08-01: qty 2

## 2014-08-01 MED ORDER — ONDANSETRON HCL 4 MG/2ML IJ SOLN
INTRAMUSCULAR | Status: DC | PRN
  Administered 2014-08-01: 4 mg via INTRAVENOUS

## 2014-08-01 MED ORDER — DEXAMETHASONE SODIUM PHOSPHATE 10 MG/ML IJ SOLN
INTRAMUSCULAR | Status: AC
  Filled 2014-08-01: qty 1

## 2014-08-01 MED ORDER — MEPERIDINE HCL 25 MG/ML IJ SOLN: 6.2500 mg | INTRAMUSCULAR | Status: DC | PRN

## 2014-08-01 MED ORDER — MORPHINE SULFATE 0.5 MG/ML IJ SOLN
INTRAMUSCULAR | Status: AC
  Filled 2014-08-01: qty 10

## 2014-08-01 MED ORDER — TETANUS-DIPHTH-ACELL PERTUSSIS 5-2.5-18.5 LF-MCG/0.5 IM SUSP: 0.5000 mL | Freq: Once | INTRAMUSCULAR | Status: DC

## 2014-08-01 MED ORDER — OXYTOCIN 40 UNITS IN LACTATED RINGERS INFUSION - SIMPLE MED: 62.5000 mL/h | INTRAVENOUS | Status: AC

## 2014-08-01 MED ORDER — KETOROLAC TROMETHAMINE 30 MG/ML IJ SOLN: 30.0000 mg | Freq: Four times a day (QID) | INTRAMUSCULAR | Status: DC | PRN

## 2014-08-01 MED ORDER — LANOLIN HYDROUS EX OINT: 1.0000 "application " | TOPICAL_OINTMENT | CUTANEOUS | Status: DC | PRN

## 2014-08-01 MED ORDER — FENTANYL 2.5 MCG/ML BUPIVACAINE 1/10 % EPIDURAL INFUSION (WH - ANES)
INTRAMUSCULAR | Status: DC | PRN
  Administered 2014-08-01: 14 mL/h via EPIDURAL

## 2014-08-01 MED ORDER — DIPHENHYDRAMINE HCL 50 MG/ML IJ SOLN: 12.5000 mg | INTRAMUSCULAR | Status: DC | PRN

## 2014-08-01 MED ORDER — SCOPOLAMINE 1 MG/3DAYS TD PT72
MEDICATED_PATCH | TRANSDERMAL | Status: AC
  Filled 2014-08-01: qty 1

## 2014-08-01 MED ORDER — ZOLPIDEM TARTRATE 5 MG PO TABS: 5.0000 mg | ORAL_TABLET | Freq: Every evening | ORAL | Status: DC | PRN

## 2014-08-01 MED ORDER — METOCLOPRAMIDE HCL 5 MG/ML IJ SOLN
INTRAMUSCULAR | Status: AC
  Filled 2014-08-01: qty 2

## 2014-08-01 MED ORDER — DIBUCAINE 1 % RE OINT: 1.0000 "application " | TOPICAL_OINTMENT | RECTAL | Status: DC | PRN

## 2014-08-01 MED ORDER — SODIUM CHLORIDE 0.9 % IJ SOLN
INTRAMUSCULAR | Status: AC
  Filled 2014-08-01: qty 20

## 2014-08-01 MED ORDER — CEFAZOLIN SODIUM-DEXTROSE 2-3 GM-% IV SOLR
INTRAVENOUS | Status: DC | PRN
  Administered 2014-08-01: 2 g via INTRAVENOUS

## 2014-08-01 MED ORDER — MEPERIDINE HCL 25 MG/ML IJ SOLN
INTRAMUSCULAR | Status: AC
  Filled 2014-08-01: qty 1

## 2014-08-01 MED ORDER — DIPHENHYDRAMINE HCL 25 MG PO CAPS: 25.0000 mg | ORAL_CAPSULE | Freq: Four times a day (QID) | ORAL | Status: DC | PRN

## 2014-08-01 MED ORDER — OXYTOCIN 10 UNIT/ML IJ SOLN
INTRAMUSCULAR | Status: AC
  Filled 2014-08-01: qty 4

## 2014-08-01 MED ORDER — METOCLOPRAMIDE HCL 5 MG/ML IJ SOLN
10.0000 mg | Freq: Three times a day (TID) | INTRAMUSCULAR | Status: DC | PRN
Start: 2014-08-01 — End: 2014-08-01

## 2014-08-01 MED ORDER — SIMETHICONE 80 MG PO CHEW
80.0000 mg | CHEWABLE_TABLET | ORAL | Status: DC | PRN
  Administered 2014-08-04: 80 mg via ORAL

## 2014-08-01 MED ORDER — DIPHENHYDRAMINE HCL 50 MG/ML IJ SOLN
INTRAMUSCULAR | Status: DC | PRN
  Administered 2014-08-01: 25 mg via INTRAVENOUS

## 2014-08-01 MED ORDER — EPHEDRINE 5 MG/ML INJ
10.0000 mg | INTRAVENOUS | Status: DC | PRN
Start: 2014-08-01 — End: 2014-08-01

## 2014-08-01 MED ORDER — EPHEDRINE 5 MG/ML INJ: 10.0000 mg | INTRAVENOUS | Status: DC | PRN

## 2014-08-01 MED ORDER — LACTATED RINGERS IV SOLN
INTRAVENOUS | Status: DC | PRN
  Administered 2014-08-01: 20:00:00 via INTRAVENOUS

## 2014-08-01 MED ORDER — PHENYLEPHRINE 40 MCG/ML (10ML) SYRINGE FOR IV PUSH (FOR BLOOD PRESSURE SUPPORT): 80.0000 ug | PREFILLED_SYRINGE | INTRAVENOUS | Status: DC | PRN

## 2014-08-01 MED ORDER — BUPIVACAINE LIPOSOME 1.3 % IJ SUSP
INTRAMUSCULAR | Status: DC | PRN
  Administered 2014-08-01: 20 mL

## 2014-08-01 MED ORDER — PROMETHAZINE HCL 25 MG/ML IJ SOLN: 6.2500 mg | INTRAMUSCULAR | Status: DC | PRN

## 2014-08-01 MED ORDER — LACTATED RINGERS IV SOLN
40.0000 [IU] | INTRAVENOUS | Status: DC | PRN
  Administered 2014-08-01: 40 [IU] via INTRAVENOUS

## 2014-08-01 MED ORDER — IBUPROFEN 600 MG PO TABS
600.0000 mg | ORAL_TABLET | Freq: Four times a day (QID) | ORAL | Status: DC
  Administered 2014-08-02 (×4): 600 mg via ORAL
  Filled 2014-08-01 (×5): qty 1

## 2014-08-01 MED ORDER — LIDOCAINE HCL (PF) 1 % IJ SOLN
INTRAMUSCULAR | Status: DC | PRN
  Administered 2014-08-01 (×2): 4 mL

## 2014-08-01 MED ORDER — PHENYLEPHRINE 40 MCG/ML (10ML) SYRINGE FOR IV PUSH (FOR BLOOD PRESSURE SUPPORT)
80.0000 ug | PREFILLED_SYRINGE | INTRAVENOUS | Status: DC | PRN
  Administered 2014-08-01: 120 ug via INTRAVENOUS
  Administered 2014-08-01: 80 ug via INTRAVENOUS
  Filled 2014-08-01: qty 10

## 2014-08-01 MED ORDER — METOCLOPRAMIDE HCL 5 MG/ML IJ SOLN
INTRAMUSCULAR | Status: DC | PRN
  Administered 2014-08-01: 10 mg via INTRAVENOUS

## 2014-08-01 MED ORDER — ONDANSETRON HCL 4 MG PO TABS: 4.0000 mg | ORAL_TABLET | ORAL | Status: DC | PRN

## 2014-08-01 MED ORDER — MEPERIDINE HCL 25 MG/ML IJ SOLN
INTRAMUSCULAR | Status: DC | PRN
  Administered 2014-08-01 (×2): 12.5 mg via INTRAVENOUS

## 2014-08-01 MED ORDER — ONDANSETRON HCL 4 MG/2ML IJ SOLN
INTRAMUSCULAR | Status: AC
  Filled 2014-08-01: qty 2

## 2014-08-01 MED ORDER — SCOPOLAMINE 1 MG/3DAYS TD PT72: 1.0000 | MEDICATED_PATCH | Freq: Once | TRANSDERMAL | Status: DC

## 2014-08-01 MED ORDER — ONDANSETRON HCL 4 MG/2ML IJ SOLN: 4.0000 mg | Freq: Four times a day (QID) | INTRAMUSCULAR | Status: DC | PRN

## 2014-08-01 MED ORDER — FENTANYL CITRATE 0.05 MG/ML IJ SOLN: 25.0000 ug | INTRAMUSCULAR | Status: DC | PRN

## 2014-08-01 MED ORDER — DIPHENHYDRAMINE HCL 50 MG/ML IJ SOLN
INTRAMUSCULAR | Status: AC
  Filled 2014-08-01: qty 1

## 2014-08-01 MED ORDER — SIMETHICONE 80 MG PO CHEW
80.0000 mg | CHEWABLE_TABLET | ORAL | Status: DC
  Administered 2014-08-02 – 2014-08-04 (×4): 80 mg via ORAL
  Filled 2014-08-01 (×4): qty 1

## 2014-08-01 SURGICAL SUPPLY — 40 items
BENZOIN TINCTURE PRP APPL 2/3 (GAUZE/BANDAGES/DRESSINGS) ×3 IMPLANT
BLADE SURG 10 STRL SS (BLADE) ×6 IMPLANT
CLAMP CORD UMBIL (MISCELLANEOUS) IMPLANT
CLOSURE WOUND 1/2 X4 (GAUZE/BANDAGES/DRESSINGS)
CLOSURE WOUND 1/4X4 (GAUZE/BANDAGES/DRESSINGS) ×1
CLOTH BEACON ORANGE TIMEOUT ST (SAFETY) ×3 IMPLANT
CONTAINER PREFILL 10% NBF 15ML (MISCELLANEOUS) IMPLANT
DERMABOND ADVANCED (GAUZE/BANDAGES/DRESSINGS)
DERMABOND ADVANCED .7 DNX12 (GAUZE/BANDAGES/DRESSINGS) IMPLANT
DRAPE LG THREE QUARTER DISP (DRAPES) IMPLANT
DRSG OPSITE POSTOP 4X10 (GAUZE/BANDAGES/DRESSINGS) ×3 IMPLANT
DURAPREP 26ML APPLICATOR (WOUND CARE) ×3 IMPLANT
ELECT REM PT RETURN 9FT ADLT (ELECTROSURGICAL) ×3
ELECTRODE REM PT RTRN 9FT ADLT (ELECTROSURGICAL) ×1 IMPLANT
EXTRACTOR VACUUM M CUP 4 TUBE (SUCTIONS) IMPLANT
EXTRACTOR VACUUM M CUP 4' TUBE (SUCTIONS)
GLOVE BIO SURGEON STRL SZ7 (GLOVE) ×3 IMPLANT
GOWN STRL REUS W/TWL LRG LVL3 (GOWN DISPOSABLE) ×6 IMPLANT
KIT ABG SYR 3ML LUER SLIP (SYRINGE) ×3 IMPLANT
NEEDLE HYPO 21X1.5 SAFETY (NEEDLE) ×3 IMPLANT
NEEDLE HYPO 25X1 1.5 SAFETY (NEEDLE) ×3 IMPLANT
NEEDLE HYPO 25X5/8 SAFETYGLIDE (NEEDLE) ×3 IMPLANT
NS IRRIG 1000ML POUR BTL (IV SOLUTION) ×3 IMPLANT
PACK C SECTION WH (CUSTOM PROCEDURE TRAY) ×3 IMPLANT
PAD OB MATERNITY 4.3X12.25 (PERSONAL CARE ITEMS) ×3 IMPLANT
SPONGE GAUZE 4X4 12PLY STER LF (GAUZE/BANDAGES/DRESSINGS) ×3 IMPLANT
STAPLER VISISTAT 35W (STAPLE) IMPLANT
STRIP CLOSURE SKIN 1/2X4 (GAUZE/BANDAGES/DRESSINGS) IMPLANT
STRIP CLOSURE SKIN 1/4X4 (GAUZE/BANDAGES/DRESSINGS) ×2 IMPLANT
SUT CHROMIC 2 0 SH (SUTURE) ×3 IMPLANT
SUT MNCRL 0 VIOLET CTX 36 (SUTURE) ×5 IMPLANT
SUT MONOCRYL 0 CTX 36 (SUTURE) ×10
SUT PDS AB 0 CTX 60 (SUTURE) ×3 IMPLANT
SUT PLAIN 0 NONE (SUTURE) IMPLANT
SUT PLAIN 2 0 XLH (SUTURE) ×3 IMPLANT
SUT VIC AB 4-0 KS 27 (SUTURE) ×3 IMPLANT
SYRINGE 20CC LL (MISCELLANEOUS) ×3 IMPLANT
TOWEL OR 17X24 6PK STRL BLUE (TOWEL DISPOSABLE) ×3 IMPLANT
TRAY FOLEY CATH 14FR (SET/KITS/TRAYS/PACK) ×3 IMPLANT
WATER STERILE IRR 1000ML POUR (IV SOLUTION) ×3 IMPLANT

## 2014-08-01 NOTE — Progress Notes (Signed)
Cx 9 (mostly a lip @ 3:00)/C/0/caput Probably LOP/LOT FHT reactive D/W patient , will position on right and check 1-1.5 hours

## 2014-08-01 NOTE — Anesthesia Postprocedure Evaluation (Signed)
  Anesthesia Post Note  Patient: Bianca Lee  Procedure(s) Performed: Procedure(s) (LRB): CESAREAN SECTION (N/A)  Anesthesia type: epidural  Patient location: PACU  Post pain: Pain level controlled  Post assessment: Post-op Vital signs reviewed  Last Vitals:  Filed Vitals:   08/01/14 2230  BP: 113/71  Pulse: 70  Temp:   Resp: 12    Post vital signs: Reviewed  Level of consciousness: awake  Complications: No apparent anesthesia complications

## 2014-08-01 NOTE — Progress Notes (Signed)
4/C/-2/vtx AROM of forebag-clear IUPC placed FHT reactive

## 2014-08-01 NOTE — Progress Notes (Signed)
Pushing about 1hour with good effort vtx +1 to +2 no change FHT some variable decels, + accels D/W patient above and options A: Arrest of Descent  P: C/S-reviewed procedure and risks including infection, organ damage, bleeding/transfusion-HIV/Hep, DVT/PE, pneumonia, return to OR. All questions answered.

## 2014-08-01 NOTE — Progress Notes (Signed)
Feeling UCs stronger, used Stadol this am  FHT reactive UCs q3-4 min Pitocin on Cx 2 cm to nurse check this am

## 2014-08-01 NOTE — Anesthesia Procedure Notes (Signed)
Epidural Patient location during procedure: OB Start time: 08/01/2014 12:12 PM  Staffing Anesthesiologist: Jacobb Alen A. Performed by: anesthesiologist   Preanesthetic Checklist Completed: patient identified, site marked, surgical consent, pre-op evaluation, timeout performed, IV checked, risks and benefits discussed and monitors and equipment checked  Epidural Patient position: sitting Prep: site prepped and draped and DuraPrep Patient monitoring: continuous pulse ox and blood pressure Approach: midline Location: L3-L4 Injection technique: LOR air  Needle:  Needle type: Tuohy  Needle gauge: 17 G Needle length: 9 cm and 9 Needle insertion depth: 5 cm cm Catheter type: closed end flexible Catheter size: 19 Gauge Catheter at skin depth: 10 cm Test dose: negative and Other  Assessment Events: blood not aspirated, injection not painful, no injection resistance, negative IV test and no paresthesia  Additional Notes Patient identified. Risks and benefits discussed including failed block, incomplete  Pain control, post dural puncture headache, nerve damage, paralysis, blood pressure Changes, nausea, vomiting, reactions to medications-both toxic and allergic and post Partum back pain. All questions were answered. Patient expressed understanding and wished to proceed. Sterile technique was used throughout procedure. Epidural site was Dressed with sterile barrier dressing. No paresthesias, signs of intravascular injection Or signs of intrathecal spread were encountered.  Patient was more comfortable after the epidural was dosed. Please see RN's note for documentation of vital signs and FHR which are stable.

## 2014-08-01 NOTE — Transfer of Care (Signed)
Immediate Anesthesia Transfer of Care Note  Patient: Bianca Lee  Procedure(s) Performed: Procedure(s) with comments: CESAREAN SECTION (N/A) - arrest of descent  Patient Location: PACU  Anesthesia Type:Epidural  Level of Consciousness: awake, alert  and oriented  Airway & Oxygen Therapy: Patient Spontanous Breathing  Post-op Assessment: Report given to PACU RN and Post -op Vital signs reviewed and stable  Post vital signs: Reviewed and stable  Complications: No apparent anesthesia complications

## 2014-08-01 NOTE — Op Note (Signed)
NAMEGURNOOR, SLOOP NO.:  [id number]  MEDICAL RECORD NO.:  [id number]  LOCATION:  [facility]                          FACILITY:  [facility]  PHYSICIAN:  Daleen Bo. Gaetano Net, M.D. DATE OF BIRTH:  1976/02/15  DATE OF PROCEDURE:  08/01/2014 DATE OF DISCHARGE:                              OPERATIVE REPORT   PREOPERATIVE DIAGNOSIS:  Arrest of descent.  POSTOPERATIVE DIAGNOSIS:  Arrest of descent.  PROCEDURE:  Primary low transverse cesarean section.  SURGEON:  Daleen Bo. Gaetano Net, M.D.  ANESTHESIA:  Epidural.  ESTIMATED BLOOD LOSS:  500 mL.  SPECIMENS:  Placenta to Pathology.  FINDINGS:  Viable infant.  Apgars and arterial cord pH and birth weight pending.  INDICATIONS AND CONSENT:  This patient is a 38 year old, G1, P0 at 40 weeks who was admitted for induction of labor yesterday.  She progresses to complete and pushing and then has arrest of descent.  Details are dictated in history and physical.  Cesarean section was discussed with the patient.  Potential risks and complications were reviewed preoperatively including but not limited to infection, organ damage, bleeding requiring transfusion of blood products with HIV and hepatitis acquisition, DVT, PE, pneumonia, return to the operating room.  All questions were answered.  Consent signed on the chart.  DESCRIPTION OF PROCEDURE:  The patient was taken to the operating room, where she is identified and her epidural anesthetic was augmented to surgical level.  She was placed in the dorsal supine position with a 15- degree left lateral wedge.  Foley catheter was already in place.  She is prepped and draped in a sterile fashion per [facility] protocol. After testing for adequate epidural anesthesia, skin was entered through a Pfannenstiel incision and dissection was carried out in layers of the peritoneum.  Peritoneum was entered and extended superiorly and inferiorly.  Vesicouterine peritoneum was taken down  cephalolaterally. Bladder flap was developed and bladder blade was placed.  Uterus was incised in a low transverse manner and the uterine cavity was entered bluntly with a hemostat.  The uterine incision was extended cephalolaterally with fingers.  The vertex is deep in the pelvis and posterior.  A hand from below was used to elevate the vertex and allow delivery of the vertex.  The baby was then delivered without difficulty. Cord was clamped and cut, and the baby was handed to awaiting pediatrics team.  Placenta is manually delivered and sent to Pathology.  Uterine cavity was clean.  There are extensions bilaterally of the uterine incisions down into the lower uterine segment.  These were then both closed in a running, locking fashion with 0 Monocryl suture.  The incision was closed in 2 running locking imbricating layers of 0 Monocryl suture.  Careful inspection revealed good hemostasis.  Anterior peritoneum was closed in running fashion with 0 Monocryl suture which was also used to reapproximate the pyramidalis muscle in midline. Anterior rectus fascia was closed in a running fashion with a 0 looped PDS suture and 20 mL of Exparel and 20 mL of saline was then injected both subcutaneously and subfascially.  Subcutaneous layers closed with interrupted plain sutures, and the skin was closed with Vicryl on a Keith needle.  Steri-Strips were applied.  Dressings were applied.  All counts were correct.  The patient was taken to the recovery room in stable condition.     Daleen Bo Gaetano Net, M.D.     JET/MEDQ  D:  08/01/2014  T:  08/01/2014  Job:  [id number]

## 2014-08-01 NOTE — Brief Op Note (Signed)
07/30/2014 - 08/01/2014  9:30 PM  PATIENT:  Bianca Lee  38 y.o. female  PRE-OPERATIVE DIAGNOSIS:  primary c-secton arrest of descent  POST-OPERATIVE DIAGNOSIS:  primary c-section for arrest of descent  PROCEDURE:  Procedure(s) with comments: CESAREAN SECTION (N/A) - arrest of descent  SURGEON:  Surgeon(s) and Role:    * Shon Millet II, MD - Primary  PHYSICIAN ASSISTANT:   ASSISTANTS: none   ANESTHESIA:   epidural  EBL:  Total I/O In: 1000 [I.V.:1000] Out: 450 [Urine:450]  BLOOD ADMINISTERED:none  DRAINS: Urinary Catheter (Foley)   LOCAL MEDICATIONS USED:  Amount: 20 ml in 20 ml of saline ml and OTHER Exparel  SPECIMEN:  Source of Specimen:  placenta  DISPOSITION OF SPECIMEN:  PATHOLOGY  COUNTS:  YES  TOURNIQUET:  * No tourniquets in log *  DICTATION: .Other Dictation: Dictation Number [id number]  PLAN OF CARE: Admit to inpatient   PATIENT DISPOSITION:  PACU - hemodynamically stable.   Delay start of Pharmacological VTE agent (>24hrs) due to surgical blood loss or risk of bleeding: not applicable

## 2014-08-01 NOTE — Anesthesia Preprocedure Evaluation (Signed)
Anesthesia Evaluation  Patient identified by MRN, date of birth, ID band Patient awake    Reviewed: Allergy & Precautions, H&P , Patient's Chart, lab work & pertinent test results  Airway Mallampati: III TM Distance: >3 FB Neck ROM: Full    Dental no notable dental hx. (+) Teeth Intact   Pulmonary asthma , former smoker,  breath sounds clear to auscultation  Pulmonary exam normal       Cardiovascular negative cardio ROS  Rhythm:Regular Rate:Normal     Neuro/Psych negative neurological ROS  negative psych ROS   GI/Hepatic Neg liver ROS, GERD-  Medicated and Controlled,  Endo/Other  Morbid obesity  Renal/GU negative Renal ROS  negative genitourinary   Musculoskeletal negative musculoskeletal ROS (+)   Abdominal (+) + obese,   Peds  Hematology  (+) anemia , Factor V Leiden deficiency   Anesthesia Other Findings   Reproductive/Obstetrics (+) Pregnancy                           Anesthesia Physical Anesthesia Plan  ASA: III  Anesthesia Plan: Epidural   Post-op Pain Management:    Induction:   Airway Management Planned: Natural Airway  Additional Equipment:   Intra-op Plan:   Post-operative Plan:   Informed Consent: I have reviewed the patients History and Physical, chart, labs and discussed the procedure including the risks, benefits and alternatives for the proposed anesthesia with the patient or authorized representative who has indicated his/her understanding and acceptance.     Plan Discussed with: Anesthesiologist  Anesthesia Plan Comments:         Anesthesia Quick Evaluation

## 2014-08-01 NOTE — Consult Note (Signed)
Neonatology Note:   Attendance at C-section:    I was asked by Dr. Gaetano Net to attend this primary C/S at term due to failure to descend. The mother is a G1P0 A neg, GBS positive with heterozygous Factor V Leiden mutation, on Lovenox. The baby is known to be LGA. ROM 11 hours prior to delivery, fluid clear. Mother received Pen G > 4 hours prior to delivery.  Infant fairly vigorous with good spontaneous cry and tone. Needed only minimal bulb suctioning. Ap 7/9. Lungs clear to ausc in DR. To CN to care of Pediatrician.   Real Cons, MD

## 2014-08-02 ENCOUNTER — Encounter (HOSPITAL_COMMUNITY): Payer: Self-pay

## 2014-08-02 LAB — CBC
HEMATOCRIT: 32.3 % — AB (ref 36.0–46.0)
Hemoglobin: 10.7 g/dL — ABNORMAL LOW (ref 12.0–15.0)
MCH: 28.8 pg (ref 26.0–34.0)
MCHC: 33.1 g/dL (ref 30.0–36.0)
MCV: 86.8 fL (ref 78.0–100.0)
Platelets: 190 10*3/uL (ref 150–400)
RBC: 3.72 MIL/uL — AB (ref 3.87–5.11)
RDW: 15 % (ref 11.5–15.5)
WBC: 14.6 10*3/uL — ABNORMAL HIGH (ref 4.0–10.5)

## 2014-08-02 MED ORDER — DIPHENHYDRAMINE HCL 12.5 MG/5ML PO ELIX
12.5000 mg | ORAL_SOLUTION | Freq: Four times a day (QID) | ORAL | Status: DC | PRN
Start: 2014-08-02 — End: 2014-08-05
  Filled 2014-08-02: qty 5

## 2014-08-02 MED ORDER — ENOXAPARIN SODIUM 40 MG/0.4ML ~~LOC~~ SOLN
40.0000 mg | SUBCUTANEOUS | Status: DC
  Administered 2014-08-02 – 2014-08-04 (×3): 40 mg via SUBCUTANEOUS
  Filled 2014-08-02 (×3): qty 0.4

## 2014-08-02 MED ORDER — DIPHENHYDRAMINE HCL 50 MG/ML IJ SOLN: 12.5000 mg | Freq: Four times a day (QID) | INTRAMUSCULAR | Status: DC | PRN

## 2014-08-02 NOTE — Anesthesia Postprocedure Evaluation (Signed)
  Anesthesia Post-op Note  Patient: Bianca Lee  Procedure(s) Performed: Procedure(s) with comments: CESAREAN SECTION (N/A) - arrest of descent  Patient Location: Mother/Baby  Anesthesia Type:Epidural  Level of Consciousness: awake and alert   Airway and Oxygen Therapy: Patient Spontanous Breathing  Post-op Pain: mild  Post-op Assessment: Post-op Vital signs reviewed, No signs of Nausea or vomiting, No headache, No residual numbness and No residual motor weakness  Post-op Vital Signs: Reviewed  Last Vitals:  Filed Vitals:   08/02/14 0653  BP: 95/50  Pulse: 66  Temp:   Resp: 17    Complications: No apparent anesthesia complications

## 2014-08-02 NOTE — Addendum Note (Signed)
Addendum created 08/02/14 5364 by Garner Nash, CRNA   Modules edited: Notes Section   Notes Section:  File: [id number]

## 2014-08-02 NOTE — Progress Notes (Signed)
Subjective: Postpartum Day 1: Cesarean Delivery Patient reports + BM.    Objective: Vital signs in last 24 hours: Temp:  [97.6 F (36.4 C)-100 F (37.8 C)] 98.5 F (36.9 C) (08/16 0250) Pulse Rate:  [25-98] 66 (08/16 0653) Resp:  [10-31] 17 (08/16 0653) BP: (95-159)/(49-109) 95/50 mmHg (08/16 0653) SpO2:  [89 %-100 %] 96 % (08/16 0653)  Physical Exam:  General: alert, cooperative and no distress Lochia: appropriate Uterine Fundus: firm Incision: healing well DVT Evaluation: No evidence of DVT seen on physical exam.   Recent Labs  07/30/14 2015 08/02/14 0555  HGB 13.2 10.7*  HCT 38.7 32.3*    Assessment/Plan: Status post Cesarean section. Doing well postoperatively.  Continue current care.  Bianca Lee,Bianca Lee E 08/02/2014, 7:16 AM

## 2014-08-03 ENCOUNTER — Encounter (HOSPITAL_COMMUNITY): Payer: Self-pay | Admitting: Obstetrics and Gynecology

## 2014-08-03 LAB — TYPE AND SCREEN
ABO/RH(D): A NEG
Antibody Screen: POSITIVE
DAT, IGG: NEGATIVE
UNIT DIVISION: 0
Unit division: 0

## 2014-08-03 NOTE — Progress Notes (Signed)
Subjective: Postpartum Day 2: Cesarean Delivery Patient reports incisional pain, tolerating PO, + BM and no problems voiding.    Objective: Vital signs in last 24 hours: Temp:  [97.7 F (36.5 C)-98.3 F (36.8 C)] 98 F (36.7 C) (08/17 0556) Pulse Rate:  [62-85] 80 (08/17 0556) Resp:  [18-20] 20 (08/17 0556) BP: (105-133)/(64-81) 133/81 mmHg (08/17 0556) SpO2:  [95 %-99 %] 99 % (08/16 2138)  Physical Exam:  General: alert and cooperative Lochia: appropriate Uterine Fundus: firm Incision: honeycomb dressing noted with old drainage noted on bandage.no active bleeding noted DVT Evaluation: No evidence of DVT seen on physical exam. Negative Homan's sign. No cords or calf tenderness. Calf/Ankle edema is present.   Recent Labs  08/02/14 0555  HGB 10.7*  HCT 32.3*    Assessment/Plan: Status post Cesarean section. Doing well postoperatively.  Continue current care.  Suetta Hoffmeister G 08/03/2014, 8:53 AM

## 2014-08-03 NOTE — Progress Notes (Signed)
Pt offered percocet  since she did not take motrin (refused) and reminded to put on SCDs before going to bed.

## 2014-08-03 NOTE — Lactation Note (Signed)
This note was copied from the chart of Bianca Rokia Bosket. Lactation Consultation Note: Assisted mom with latch after Dr. [place] Crane did frenotomy. Baby would not latch to bare breast but did latch to breast with NS. Placed some formula in NS and baby took several good sucks and mom reports this is 100% better.Nursed for about 10 min and off to sleep. Encouraged mom to continue pumping with DEBP to promote milk supply until baby nursing more. . Plans to get pump from insurance company but it may take several days. Discussed 2 week rental from [place]. No questions at present. To call for assist prn  Patient Name: Bianca Lee [id number] Date: 08/03/2014 Reason for consult: Follow-up assessment   Maternal Data Formula Feeding for Exclusion: No  Feeding Feeding Type: Breast Fed Length of feed: 10 min  LATCH Score/Interventions Latch: Grasps breast easily, tongue down, lips flanged, rhythmical sucking. (with NS)  Audible Swallowing: A few with stimulation  Type of Nipple: Flat Intervention(s): Hand pump;Double electric pump  Comfort (Breast/Nipple): Soft / non-tender     Hold (Positioning): Assistance needed to correctly position infant at breast and maintain latch. Intervention(s): Breastfeeding basics reviewed;Support Pillows;Skin to skin  LATCH Score: 7  Lactation Tools Discussed/Used     Consult Status Consult Status: Follow-up Date: 08/04/14 Follow-up type: In-patient    Truddie Crumble 08/03/2014, 12:30 PM

## 2014-08-03 NOTE — Progress Notes (Signed)
Patient stated at midnight medication rounds that Dr Gaetano Net told her to not to take Motrin while taking  Lovenox.  She does have Percocet available; refused it at midnight, and has requested it for her six-oclock medication.  Order for motrin is still in [facility].

## 2014-08-04 MED ORDER — ENOXAPARIN SODIUM 40 MG/0.4ML ~~LOC~~ SOLN: 40.0000 mg | SUBCUTANEOUS | Status: DC

## 2014-08-04 MED ORDER — OXYCODONE-ACETAMINOPHEN 5-325 MG PO TABS: 1.0000 | ORAL_TABLET | ORAL | Status: DC | PRN

## 2014-08-04 NOTE — Progress Notes (Signed)
Subjective: Postpartum Day 3: Cesarean Delivery Patient reports tolerating PO, + flatus, + BM and no problems voiding.    Objective: Vital signs in last 24 hours: Temp:  [98 F (36.7 C)-98.5 F (36.9 C)] 98 F (36.7 C) (08/18 0620) Pulse Rate:  [78-89] 78 (08/18 0620) Resp:  [18-20] 18 (08/18 0620) BP: (111-114)/(66-70) 114/70 mmHg (08/18 0620) SpO2:  [98 %] 98 % (08/17 1810)  Physical Exam:  General: alert, cooperative, appears stated age and no distress Lochia: appropriate Uterine Fundus: firm Incision: healing well DVT Evaluation: No evidence of DVT seen on physical exam.   Recent Labs  08/02/14 0555  HGB 10.7*  HCT 32.3*    Assessment/Plan: Status post Cesarean section. Doing well postoperatively.  Continue current care Baby needs to stay for feeding issues. Pt c/o soreness and edema Anticipate D/c tomorrow  Bianca Lee C 08/04/2014, 9:26 AM

## 2014-08-04 NOTE — Discharge Summary (Signed)
Obstetric Discharge Summary Reason for Admission: induction of labor Prenatal Procedures: ultrasound Intrapartum Procedures: cesarean: low cervical, transverse Postpartum Procedures: none Complications-Operative and Postpartum: none Hemoglobin  Date Value Ref Range Status  08/02/2014 10.7* 12.0 - 15.0 g/dL Final     HCT  Date Value Ref Range Status  08/02/2014 32.3* 36.0 - 46.0 % Final    Physical Exam:  General: alert and cooperative Lochia: appropriate Uterine Fundus: firm Incision: healing well DVT Evaluation: No evidence of DVT seen on physical exam. Negative Homan's sign. No cords or calf tenderness. Calf/Ankle edema is present.  Discharge Diagnoses: Term Pregnancy-delivered  Discharge Information: Date: 08/04/2014 Activity: pelvic rest Diet: routine Medications: PNV, Percocet and lovenox Condition: stable Instructions: refer to practice specific booklet Discharge to: home   Newborn Data: Live born female  Birth Weight: 9 lb 1.5 oz (4125 g) APGAR: 7, 9  Home with mother.  CURTIS,CAROL G 08/04/2014, 8:25 AM

## 2014-08-04 NOTE — Lactation Note (Addendum)
This note was copied from the chart of Bianca Lee. Lactation Consultation Note  Patient Name: Bianca Lee HQION'G Date: 08/04/2014 Reason for consult: Follow-up assessment Baby 62 hours of life. Mom reports parents are finger-feeding baby formula, 87mls earlier this morning was last feed. Mom reports baby suckling better since frenotomy. Enc mom to put baby to breast first at each feeding, then use NS as needed. Mom states she has been using #24 NS, not sure if this is correct size. Refitted mom with #20, mom reports increased comfort, and baby stays on NS better. Baby supplemented with 15 mls of formula through NS at breast, FOB return-demonstrated refilling NS with curve-tipped syringe. Assisted mom to post-pump. Mom able to hand express drops of colostrum from both breast. No EBM with pumping yet. Baby still hungry, so assisted FOB to supplement with additional 38mls of formula with curve-tipped syringe and finger feeding. Parents given supplementation guidelines and enc to feed with cues at the breast. Enc mom to post-pump for 15 minutes after each feed, massaging breast prior to pumping and hand expressing after each pumping session. Enc mom to offer baby collected EBM first before making up the difference with formula. Discussed goal of moving away from NS as milk comes in and nipples evert. Enc mom to make OP appointment for assistance with latching baby directly to breast as needed. Parents have filled out paperwork for 2-week DEBP rental at discharge until she receives personal DEBP. Enc parents to call for assistance as needed.   Maternal Data    Feeding Feeding Type: Formula (Mom post-pumping, so FOB finger-fed additional 29mls formula with syringe.)  LATCH Score/Interventions Latch: Grasps breast easily, tongue down, lips flanged, rhythmical sucking. Intervention(s): Adjust position;Assist with latch;Breast compression  Audible Swallowing: Spontaneous and  intermittent  Type of Nipple: Flat Intervention(s): Double electric pump  Comfort (Breast/Nipple): Soft / non-tender     Hold (Positioning): No assistance needed to correctly position infant at breast. Intervention(s): Breastfeeding basics reviewed;Support Pillows;Position options;Skin to skin  LATCH Score: 9  Lactation Tools Discussed/Used Tools: Nipple Shields Nipple shield size: 20   Consult Status Consult Status: Follow-up Date: 08/05/14 Follow-up type: In-patient    Inocente Salles 08/04/2014, 11:22 AM

## 2014-08-04 NOTE — Plan of Care (Signed)
Problem: Discharge Progression Outcomes Goal: Barriers To Progression Addressed/Resolved Patient staying another day due to soreness and edema.

## 2014-08-05 ENCOUNTER — Encounter (HOSPITAL_COMMUNITY): Payer: Self-pay

## 2014-08-05 ENCOUNTER — Ambulatory Visit: Payer: Self-pay

## 2014-08-05 NOTE — Progress Notes (Signed)
Subjective: Postpartum Day 4: Cesarean Delivery Patient reports tolerating PO, + BM and no problems voiding.    Objective: Vital signs in last 24 hours: Temp:  [97.8 F (36.6 C)-98.3 F (36.8 C)] 97.8 F (36.6 C) (08/19 0533) Pulse Rate:  [78-84] 84 (08/19 0533) Resp:  [18] 18 (08/19 0533) BP: (100-116)/(56-61) 116/61 mmHg (08/19 0533) SpO2:  [99 %] 99 % (08/19 0533)  Physical Exam:  General: alert and cooperative Lochia: appropriate Uterine Fundus: firm Incision: healing well DVT Evaluation: No evidence of DVT seen on physical exam. Negative Homan's sign. No cords or calf tenderness. Calf/Ankle edema is present.  No results found for this basename: HGB, HCT,  in the last 72 hours  Assessment/Plan: Status post Cesarean section. Doing well postoperatively.  Discharge home with standard precautions and return to clinic in 1-2 weeks.  Tiffnay Bossi G 08/05/2014, 8:21 AM

## 2014-08-05 NOTE — Lactation Note (Signed)
This note was copied from the chart of Bianca Lee. Lactation Consultation Note  Patient Name: Bianca Allan Bacigalupi MVHQI'O Date: 08/05/2014 Reason for consult: Follow-up assessment;Difficult latch;Pump rental Mom has been supplementing using curved tipped syringe and nipple shield. LC demonstrated how to use SNS to supplement at breast while using nipple shield to help with latch. LC assisted Mom with latching baby in football hold. Demonstrated set up/cleaning of SNS. Baby demonstrated a good rhythmic suck, swallows noted with SNS open. Mom using #20 nipple shield. Plan at this time is to BF with each feeding, use SNS to supplement, post pump to encourage milk production, prevent engorgement and protect milk supply. Mom is renting DEBP for 2 weeks till pump from insurance arrives. Engorgement care reviewed if needed. OP f/u scheduled for Wednesday, 08/12/14 at 1:00 pm. Advised of support group. Advised Mom if SNS becomes overwhelming she can use bottle to supplement. Advised to continue supplements till milk is in. Once milk is in, pre-pump to get her milk flow going then see if baby will sustain latch without having to use supplement at breast. Guidelines for supplementing reviewed with Mom. Advised to monitor void/stools. Advised once Mom's milk is in, if baby is nursing well, breast milk in nipple shield with feedings, adequate voids/stools then she could decrease supplements to prn.   Maternal Data    Feeding Feeding Type: Formula Length of feed: 30 min  LATCH Score/Interventions Latch: Grasps breast easily, tongue down, lips flanged, rhythmical sucking. (using #20 nipple shield) Intervention(s): Adjust position;Assist with latch;Breast massage  Audible Swallowing: Spontaneous and intermittent (w/SNS/supplement at breast)  Type of Nipple: Everted at rest and after stimulation (short nipple shaft, almost flat)  Comfort (Breast/Nipple): Filling, red/small blisters or bruises, mild/mod  discomfort  Problem noted: Filling  Hold (Positioning): Assistance needed to correctly position infant at breast and maintain latch. Intervention(s): Breastfeeding basics reviewed;Support Pillows;Position options;Skin to skin  LATCH Score: 8  Lactation Tools Discussed/Used Tools: Nipple Shields;Pump;Supplemental Nutrition System;Comfort gels Nipple shield size: 20 Breast pump type: Double-Electric Breast Pump   Consult Status Consult Status: Complete Date: 08/05/14 Follow-up type: In-patient    Katrine Coho 08/05/2014, 12:30 PM

## 2014-08-05 NOTE — Plan of Care (Signed)
Problem: Discharge Progression Outcomes Goal: Discharge plan in place and appropriate Outcome: Completed/Met Date Met:  08/05/14 PT MORE COMFORTABLE WITH THE BREASTFEEDING PLAN

## 2014-08-12 ENCOUNTER — Ambulatory Visit (HOSPITAL_COMMUNITY)
Admit: 2014-08-12 | Discharge: 2014-08-12 | Disposition: A | Discharge status: Home / Self Care | Payer: BC Managed Care – PPO | Attending: Obstetrics and Gynecology | Admitting: Obstetrics and Gynecology

## 2014-08-12 NOTE — Lactation Note (Signed)
Lactation Consult  Mother's reason for visit:  Will not latch  Visit Type: feeding assessment - difficult latch  Appointment Notes:  Scheduled as inpatient , nipple shield /SNS - confirmed  Consult:  Initial Lactation Consultant:  Bianca Lee  ________________________________________________________________________ 49 Name: Bianca Lee  Date of Birth: 08/01/2013  Pediatrician: dr. Einar Lee  Gender: female  Gestational Age: [redacted]w[redacted]d (At Birth)  Birth Weight: 9 lb 1.5 oz (4125 g)  Weight at Discharge: Weight: 8 lb 11.2 oz (3946 g) Date of Discharge: 08/05/2014  Filed Weights   08/03/14 0028 08/03/14 2330 08/05/14 0005  Weight: 8 lb 11.3 oz (3950 g) 8 lb 8 oz (3855 g) 8 lb 11.2 oz (3946 g)  Last weight taken from location outside of [place] 8-14 Pediatrician's office  Weight today: 8-15.1 oz  __________________________________________________________________  Mother's Name: Bianca Lee Type of delivery:   Breastfeeding Experience:  Per mom none , baby has never latched on at the breast except with a nipple shield and I have been pumping  Maternal Medical Conditions:  Increased bleeding at home  Maternal Medications:  PNV,Zantac , Lovenog,Methergine recently due to delayed increased bleeding at home , dose for 3 days.   ________________________________________________________________________  Breastfeeding History (Post Discharge)- difficult latch requiring a nipple shield and pumping   Frequency of breastfeeding:  No latching , attempts, EBM /formula , bottle  Duration of feeding:  Unable to keep latch   Supplementing - with a Dr. Owens Lee premie nipple , EBM and formula viai a bottle  Pumping - every 3-4 hours with a DEBP Medela  Per mom pumping off 2 oz off each breast ( 4 oz total at 10 days )   Infant Intake and Output Assessment  Voids:  4-5  in 24 hrs.  Color:  Clear yellow Stools:  5-6  in 24 hrs.  Color:   Yellow  ________________________________________________________________________  Maternal Breast Assessment  Breast:  Full Nipple:  Flat Pain level:  0 Pain interventions:  Expressed milk ,  Noted semi inverted nipple on the right and on the left short shaft nipple and semi compressible areola  Per mom has been trying to latch with a nipple shield and the baby fights at the breast and the nipple shield slips off. _______________________________________________________________________ Feeding Assessment/Evaluation  Initial feeding assessment:  Infant's oral assessment:  Variance, noted a high palate   Positioning:  Cross cradle Left breast Attempted football at 1st an dthe baby was so fussy on able to latch , ended up giving the baby a portion of is appetizer with a bottle to settle him down so LC could work with him. Breast tissue is a boarder- line candidate for a nipple shield. After pre pumping the nipple shield seemed to fit better and the nipple was alittle more erect , LC filled the Nipple shield with EBM with a curved tip syringe , baby latched after several attempts. And once the he finished the milk in the nipple shield seemed disinterested and released. LC attempted several times. The 3rd try the baby stayed latched and fed for awhile and stayed in a good pattern with swallows. Milk noted in the nipple shield when he finished,  LATCH documentation:  Latch:  1 = Repeated attempts needed to sustain latch, nipple held in mouth throughout feeding, stimulation needed to elicit sucking reflex.  Audible swallowing:  2 = Spontaneous and intermittent  Type of nipple:  1 = Flat  Comfort (Breast/Nipple):  1 = Filling, red/small blisters or  bruises, mild/mod discomfort- full   Hold (Positioning):  1 = Assistance needed to correctly position infant at breast and maintain latch  LATCH score:  6   Attached assessment:  Shallow , at 1st , and , noted to have a tendency to nipple his way on  ,   Lips flanged:  Yes.    Lips untucked:  Yes.    Suck assessment:  Nutritive- if there was milk in the nipple shield   Tools:  Nipple shield 24 mm, pump ( DEBP set up at consult  Instructed on use and cleaning of tool:  No.( mom has been using at home and aware of how to care for it )  Latched  Pre-feed weight:  4056 g  (8 lb. 15.1  oz.) Post-feed weight:  4108  g (9 lb. 0.9 oz.) Amount transferred:  22 ml  Amount supplemented:  30 ml ( given prior to latch   Latched a second time on the same breast  Pre-weight - 4110g 9-1 oz  Post - weight - 4134g 9-1.8 oz  Amount transferred = 9 ml  Total amount pumped post feed:  R only left    L 22  ml Amount supplemented - 10 ml  Total amount transferred:  31 ml  Total supplement given:  40 ml  Total volume at feeding = 71 ml   Lactation Plan of care - Praised mom for her efforts protecting Mom - encouraged rest , naps , plenty fluids , esp . Water , nutritious snacks and meals  Protect your established milk supply  Option #1 - When Bianca Lee calm is the best time to latch with nipple shield ,  May need an appetizer from a bottle  prepump if to full , and apply nipple shield with EBM /curved tip syringe  Latch /cross cradle, work on tapping upper lip , so he will open wide for a deeper latch. Option #2 - Give Bianca Lee Bottle - for main meal / latch after meal . Pumping = post pump 10 mins if she latches , post pump 15 -20 mins

## 2014-08-19 ENCOUNTER — Ambulatory Visit (HOSPITAL_COMMUNITY)
Admission: RE | Admit: 2014-08-19 | Discharge: 2014-08-19 | Disposition: A | Discharge status: Home / Self Care | Payer: BC Managed Care – PPO | Source: Ambulatory Visit | Attending: Obstetrics and Gynecology | Admitting: Obstetrics and Gynecology

## 2014-08-19 NOTE — Lactation Note (Signed)
Lactation Consult  Mother's reason for visit:  Latching issues , will not stay on , getting frustrated Visit Type:  Feeding assessment /follow up  Appointment Notes: follow up, difficult latch , using the nipple shield #24, mom still has some swelling areola , pumping , apt confirmed.  Consult:  Follow-Up Lactation Consultant:  Myer Haff  ________________________________________________________________________  Bianca Lee Name: Bianca Lee  Date of Birth: 08/01/2013  Pediatrician: Dr. Einar Gip  Gender: female  Gestational Age: [redacted]w[redacted]d (At Birth)  Birth Weight: 9 lb 1.5 oz (4125 g)  Weight at Discharge: Weight: 8 lb 11.2 oz (3946 g) Date of Discharge: 08/05/2014  Filed Weights   08/03/14 0028 08/03/14 2330 08/05/14 0005  Weight: 8 lb 11.3 oz (3950 g) 8 lb 8 oz (3855 g) 8 lb 11.2 oz (3946 g)  Last weight taken from location outside of [facility]: 9-14.2 oz , lactation office  Weight today: 4486g , 9-14.2 oz  ________________________________________________________________  Mother's Name: Bianca Lee Type of delivery:   Breastfeeding Experience:  Challenges    ________________________________________________________________________  Breastfeeding History (Post Discharge)  Frequency of breastfeeding:  Trying 2-3 times a day  Duration of feeding: 5-10 mins   Supplementing - 3-5 oz of Enfamil  Pumping - Medela - every 4 hours - from a bottle , Dr, Owens Shark   Infant Intake and Output Assessment  Voids:  5-6 24 hrs.  Color:  Clear yellow Stools:  5-6 24 hrs.  Color:  Yellow  ________________________________________________________________________  Maternal Breast Assessment  Breast:  Full Nipple:  Flat Pain level:  0 Pain interventions:  Expressed breast milk  _______________________________________________________________________ Feeding Assessment/Evaluation  Initial feeding assessment:  Infant's oral assessment:  WNL  Positioning:  Football Right  breast  LATCH documentation:  Latch:  2 = Grasps breast easily, tongue down, lips flanged, rhythmical sucking.  Audible swallowing:  2 = Spontaneous and intermittent  Type of nipple:  1 = Flat areola semi compressible   Comfort (Breast/Nipple):  1 = Filling, red/small blisters or bruises, mild/mod discomfort- full   Hold (Positioning):  1 = Assistance needed to correctly position infant at breast and maintain latch  LATCH score: 7   Attached assessment:  Shallow- corrected by LC   Lips flanged:  Yes.    Lips untucked:  Yes.    Suck assessment:  Nutritive, noted intermittent non - nutritive   Tools:  Nipple shield 20 mm, resized for #24 , #20 still fits the best  Instructed on use and cleaning of tool:  Mom aware, Wet diaper changed  Pre-feed weight: 4430    g  (9 lb. 12.3 oz.) Post-feed weight:  4454 g (9 lb. 13.1  oz.) Amount transferred:  24 ml Amount supplemented:  10  Ml used as an appetizer ( so breast milk was 14 ml )   Additional Feeding Assessment -   Infant's oral assessment:  WNL  Positioning:  Cross cradle Left breast  LATCH documentation:  Latch:  1 = Repeated attempts needed to sustain latch, nipple held in mouth throughout feeding, stimulation needed to elicit sucking reflex.  Audible swallowing:  2 = Spontaneous and intermittent  Type of nipple:  1 = Flat  Comfort (Breast/Nipple):  1 = Filling, red/small blisters or bruises, mild/mod discomfort  Hold (Positioning):  1 = Assistance needed to correctly position infant at breast and maintain latch  LATCH score:  6 , baby fussy at 1st , needed an appetizer form the bottle , and then latched   Attached  assessment:  Deep  Lips flanged:  Yes.    Lips untucked:  Yes.    Suck assessment:  Nutritive and Displays both  Tools:  Nipple shield 24 mm Instructed on use and cleaning of tool:  No. Wet changed  Pre-feed weight:  4444 g  (9 lb. 12.8  oz.) Post-feed weight: 4482   g (9 lb. 14.1  oz.) Amount transferred:  38   ml Amount supplemented:  None    Total amount pumped post feed: mom pumped for less than 5 mins and the DEBp malfunctioned  Total amount transferred: 52  ml Total supplement given:  10 ml Lactation plan of care - Take [name] to the breast , amy have to give appetizer  If still fussy , give her a larger portion and the breast after  Keep up the consistent pumping

## 2014-09-17 ENCOUNTER — Other Ambulatory Visit: Payer: Self-pay | Admitting: Obstetrics and Gynecology

## 2014-09-18 LAB — CYTOLOGY - PAP

## 2014-10-19 ENCOUNTER — Encounter (HOSPITAL_COMMUNITY): Payer: Self-pay

## 2016-06-12 ENCOUNTER — Telehealth: Payer: Self-pay | Admitting: Gastroenterology

## 2016-06-12 NOTE — Telephone Encounter (Signed)
The pt has not been seen in our clinic since 2013, I advised the pt to call her PCP and see if they can evaluate her until her appt with Janett Billow.  She states she will call them and get in with them and keep appt with [facility] as scheduled.

## 2016-06-23 ENCOUNTER — Ambulatory Visit: Payer: BC Managed Care – PPO | Admitting: Gastroenterology

## 2016-06-28 ENCOUNTER — Ambulatory Visit (INDEPENDENT_AMBULATORY_CARE_PROVIDER_SITE_OTHER): Payer: BC Managed Care – PPO | Admitting: Gastroenterology

## 2016-06-28 ENCOUNTER — Encounter: Payer: Self-pay | Admitting: Gastroenterology

## 2016-06-28 ENCOUNTER — Other Ambulatory Visit (INDEPENDENT_AMBULATORY_CARE_PROVIDER_SITE_OTHER): Payer: BC Managed Care – PPO

## 2016-06-28 VITALS — BP 112/80 | HR 64 | Ht 69.0 in | Wt 249.4 lb

## 2016-06-28 DIAGNOSIS — R197 Diarrhea, unspecified: Secondary | ICD-10-CM | POA: Diagnosis not present

## 2016-06-28 DIAGNOSIS — R1084 Generalized abdominal pain: Secondary | ICD-10-CM

## 2016-06-28 DIAGNOSIS — K529 Noninfective gastroenteritis and colitis, unspecified: Secondary | ICD-10-CM | POA: Insufficient documentation

## 2016-06-28 LAB — COMPREHENSIVE METABOLIC PANEL
ALK PHOS: 71 U/L (ref 39–117)
ALT: 28 U/L (ref 0–35)
AST: 22 U/L (ref 0–37)
Albumin: 4.3 g/dL (ref 3.5–5.2)
BUN: 13 mg/dL (ref 6–23)
CALCIUM: 9.8 mg/dL (ref 8.4–10.5)
CO2: 29 mEq/L (ref 19–32)
CREATININE: 0.8 mg/dL (ref 0.40–1.20)
Chloride: 102 mEq/L (ref 96–112)
GFR: 84.96 mL/min (ref >60.00)
Glucose, Bld: 108 mg/dL — ABNORMAL HIGH (ref 70–99)
Potassium: 3.7 mEq/L (ref 3.5–5.1)
Sodium: 138 mEq/L (ref 135–145)
TOTAL PROTEIN: 7.6 g/dL (ref 6.0–8.3)
Total Bilirubin: 0.4 mg/dL (ref 0.2–1.2)

## 2016-06-28 LAB — CBC WITH DIFFERENTIAL/PLATELET
BASOS ABS: 0 10*3/uL (ref 0.0–0.1)
Basophils Relative: 0.5 % (ref 0.0–3.0)
EOS ABS: 0.1 10*3/uL (ref 0.0–0.7)
Eosinophils Relative: 1.4 % (ref 0.0–5.0)
HEMATOCRIT: 38.7 % (ref 36.0–46.0)
Hemoglobin: 12.8 g/dL (ref 12.0–15.0)
LYMPHS PCT: 32.1 % (ref 12.0–46.0)
Lymphs Abs: 2 10*3/uL (ref 0.7–4.0)
MCHC: 33 g/dL (ref 30.0–36.0)
MCV: 83 fl (ref 78.0–100.0)
MONOS PCT: 7.8 % (ref 3.0–12.0)
Monocytes Absolute: 0.5 10*3/uL (ref 0.1–1.0)
Neutro Abs: 3.5 10*3/uL (ref 1.4–7.7)
Neutrophils Relative %: 58.2 % (ref 43.0–77.0)
Platelets: 250 10*3/uL (ref 150.0–400.0)
RBC: 4.66 Mil/uL (ref 3.87–5.11)
RDW: 14.8 % (ref 11.5–15.5)
WBC: 6.1 10*3/uL (ref 4.0–10.5)

## 2016-06-28 LAB — HIGH SENSITIVITY CRP: CRP HIGH SENSITIVITY: 17.02 mg/L — AB (ref 0.000–5.000)

## 2016-06-28 LAB — TSH: TSH: 2.11 u[IU]/mL (ref 0.35–4.50)

## 2016-06-28 LAB — SEDIMENTATION RATE: Sed Rate: 29 mm/hr — ABNORMAL HIGH (ref 0–20)

## 2016-06-28 MED ORDER — CHOLESTYRAMINE 4 G PO PACK: PACK | ORAL | Status: DC

## 2016-06-28 MED ORDER — HYOSCYAMINE SULFATE 0.125 MG SL SUBL: 0.1250 mg | SUBLINGUAL_TABLET | Freq: Four times a day (QID) | SUBLINGUAL | Status: DC | PRN

## 2016-06-28 NOTE — Progress Notes (Signed)
i agree with the above note, plan 

## 2016-06-28 NOTE — Patient Instructions (Addendum)
Please go to the basement level to have your labs drawn.  We sent a prescription for Levsin 0.125 mg,  For abdominal pain to [facility] [address], [place], [place].   We made you an appointment with Dr. Owens Loffler for 09-01-2016 at 10:30 am.

## 2016-06-28 NOTE — Progress Notes (Signed)
06/28/2016 Bianca Lee [id number] 1976-08-14   HISTORY OF PRESENT ILLNESS:  This is a pleasant 40 year old female who is known to Dr. Ardis Hughs. She had a colonoscopy in October 2013. This was performed since her father has history of rectal cancer, but she was also having complaints of diarrhea at that time. The study was normal and random biopsies throughout the colon were negative for microscopic colitis. It was recommended that she have a repeat colonoscopy in 5 years from that time. Celiac labs were negative at that time as well. She presents to our office today stating that she has had years of diarrhea, but it seemed to get worse after her cholecystectomy in 2011. She has diarrhea or loose stools most days and his gotten to the point where she has to plan her life around it to some degree. She sees mucus in her stool on occasions. No blood in her stool just small amounts of bright red blood on the toilet paper when wiping too much. She does get episodes of severe abdominal cramping and soreness associated with diarrhea that lasts for 3-4 days at a time and sometimes she has a low-grade fever with these episodes as well.  Past Medical History  Diagnosis Date  . Arthritis   . Asthma     Childhood  . Factor 5 Leiden mutation, heterozygous ([place])   . Cancer ([place])     basal cell carcinoma   Past Surgical History  Procedure Laterality Date  . Cholecystectomy    . Tonsillectomy    . Cesarean section N/A 08/01/2014    Procedure: CESAREAN SECTION;  Surgeon: Allena Katz, MD;  Location: [facility];  Service: Obstetrics;  Laterality: N/A;  arrest of descent    reports that she has quit smoking. Her smoking use included Cigarettes. She has never used smokeless tobacco. She reports that she does not drink alcohol or use illicit drugs. family history includes Cancer in her paternal aunt; Clotting disorder in her father and sister; Crohn's disease in her maternal aunt; Diabetes in her  paternal aunt; Heart disease in her maternal grandfather; Irritable bowel syndrome in her maternal aunt; Lung cancer in her maternal grandmother; Prostate cancer in her paternal uncle; Rectal cancer in her father and paternal uncle; Skin cancer in her mother. Allergies  Allergen Reactions  . Morphine And Related Other (See Comments)    Pt states that this medication gave her a headache.       Outpatient Encounter Prescriptions as of 06/28/2016  Medication Sig  . diphenhydrAMINE (BENADRYL) 25 MG tablet Take 25 mg by mouth every evening.  . sertraline (ZOLOFT) 50 MG tablet Take 50 mg by mouth daily.  . [DISCONTINUED] acetaminophen (TYLENOL) 500 MG tablet Take 1,000 mg by mouth 2 (two) times daily as needed for headache.  . [DISCONTINUED] enoxaparin (LOVENOX) 40 MG/0.4ML injection Inject 0.4 mLs (40 mg total) into the skin daily.  . [DISCONTINUED] oxyCODONE-acetaminophen (PERCOCET/ROXICET) 5-325 MG per tablet Take 1-2 tablets by mouth every 4 (four) hours as needed for severe pain (moderate - severe pain).  . [DISCONTINUED] Prenatal Vit-Fe Fumarate-FA (PRENATAL MULTIVITAMIN) TABS tablet Take 1 tablet by mouth daily.   No facility-administered encounter medications on file as of 06/28/2016.     REVIEW OF SYSTEMS  : All other systems reviewed and negative except where noted in the History of Present Illness.   PHYSICAL EXAM: BP 112/80 mmHg  Pulse 64  Ht 5\' 9"  (1.753 m)  Wt 249 lb 6.4  oz (113.127 kg)  BMI 36.81 kg/m2 General: Well developed white female in no acute distress Head: Normocephalic and atraumatic Eyes:  Sclerae anicteric, conjunctiva pink. Ears: Normal auditory acuity Lungs: Clear throughout to auscultation Heart: Regular rate and rhythm Abdomen: Soft, non-distended.  Normal bowel sounds.  Non-tender. Musculoskeletal: Symmetrical with no gross deformities  Skin: No lesions on visible extremities Extremities: No edema  Neurological: Alert oriented x 4, grossly  non-focal Psychological:  Alert and cooperative. Normal mood and affect  ASSESSMENT AND PLAN: -40 year old female with long-standing diarrhea and episodic abdominal cramping:  Colonoscopy previously negative along with random biopsies. Celiac studies negative. Likely has IBS that has been worsening complicated by post cholecystectomy/bile salt diarrhea.  We'll check labs including CBC, CMP, TSH, sedimentation rate, and CRP. We will try Questran once daily to start and then give her Levsin to use on an as-needed basis. We will have her return in approximately 6 weeks for an update on her symptoms.  CC:  Albina Billet, MD

## 2016-07-05 ENCOUNTER — Other Ambulatory Visit: Payer: Self-pay | Admitting: *Deleted

## 2016-07-05 DIAGNOSIS — R197 Diarrhea, unspecified: Secondary | ICD-10-CM

## 2016-07-06 ENCOUNTER — Encounter: Payer: Self-pay | Admitting: *Deleted

## 2016-07-14 ENCOUNTER — Other Ambulatory Visit: Payer: BC Managed Care – PPO

## 2016-07-14 DIAGNOSIS — R197 Diarrhea, unspecified: Secondary | ICD-10-CM

## 2016-07-20 LAB — CALPROTECTIN, FECAL: Calprotectin, Fecal: <16 ug/g (ref 0–120)

## 2016-09-01 ENCOUNTER — Ambulatory Visit (INDEPENDENT_AMBULATORY_CARE_PROVIDER_SITE_OTHER): Payer: BC Managed Care – PPO | Admitting: Gastroenterology

## 2016-09-01 ENCOUNTER — Encounter (INDEPENDENT_AMBULATORY_CARE_PROVIDER_SITE_OTHER): Payer: Self-pay

## 2016-09-01 ENCOUNTER — Encounter: Payer: Self-pay | Admitting: Gastroenterology

## 2016-09-01 VITALS — BP 112/80 | HR 68 | Ht 66.75 in | Wt 253.4 lb

## 2016-09-01 DIAGNOSIS — Z800 Family history of malignant neoplasm of digestive organs: Secondary | ICD-10-CM

## 2016-09-01 DIAGNOSIS — R197 Diarrhea, unspecified: Secondary | ICD-10-CM

## 2016-09-01 MED ORDER — NA SULFATE-K SULFATE-MG SULF 17.5-3.13-1.6 GM/177ML PO SOLN: 1.0000 | Freq: Once | ORAL | 0 refills | Status: AC

## 2016-09-01 NOTE — Progress Notes (Signed)
Review of pertinent gastrointestinal problems: 1. FH of colorectal cancer; Dr. Ardis Hughs colonoscopy in October 2013. This was performed since her father has history of rectal cancer, but she was also having complaints of diarrhea at that time. The study was normal and random biopsies throughout the colon were negative for microscopic colitis. 2. Diarrhea, see above (biopsies showed no microscopic colitis)  HPI: This is a very pleasant 40 year old woman. She was last here in the office with Janett Billow about 2 months ago Chief complaint is chronic diarrhea  Lab work July 2017; normal CBC, normal complete about profile, normal thyroid TSH, elevated CRP, elevated sedimentation rate, normal fecal caliber protection.  She try cholestyramine, 4 g once daily and has noticed some improvement in her diarrhea since then.  She took this not every day, but intermittent only.  Never really has solid stools.  She has arthritis; has never been seen by rheumatology.  Flare of worse than usual diarrhea, fevers, really affecting her life.  ROS: complete GI ROS as described in HPI.  Constitutional:  No unintentional weight loss   Past Medical History:  Diagnosis Date  . Arthritis   . Asthma    Childhood  . Cancer ([place])    basal cell carcinoma  . Factor 5 Leiden mutation, heterozygous [facility]     Past Surgical History:  Procedure Laterality Date  . CESAREAN SECTION N/A 08/01/2014   Procedure: CESAREAN SECTION;  Surgeon: Allena Katz, MD;  Location: [facility];  Service: Obstetrics;  Laterality: N/A;  arrest of descent  . CHOLECYSTECTOMY    . TONSILLECTOMY      Current Outpatient Prescriptions  Medication Sig Dispense Refill  . cholestyramine (QUESTRAN) 4 g packet Take 1 pack , mix with juice or water, daily. 30 each 3  . diphenhydrAMINE (BENADRYL) 25 MG tablet Take 25 mg by mouth every evening.    . sertraline (ZOLOFT) 50 MG tablet Take 50 mg by mouth daily.    . hyoscyamine (LEVSIN/SL) 0.125 MG SL  tablet Place 1 tablet (0.125 mg total) under the tongue every 6 (six) hours as needed. (Patient not taking: Reported on 09/01/2016) 20 tablet 1   No current facility-administered medications for this visit.     Allergies as of 09/01/2016 - Review Complete 09/01/2016  Allergen Reaction Noted  . Morphine and related Other (See Comments) 10/09/2012    Family History  Problem Relation Age of Onset  . Rectal cancer Father   . Clotting disorder Father   . Rectal cancer Paternal Uncle   . Prostate cancer Paternal Uncle   . Clotting disorder Sister   . Crohn's disease Maternal Aunt   . Irritable bowel syndrome Maternal Aunt   . Diabetes Paternal Aunt   . Cancer Paternal Aunt     unknown type  . Heart disease Maternal Grandfather   . Skin cancer Mother   . Lung cancer Maternal Grandmother     Social History   Social History  . Marital status: Married    Spouse name: N/A  . Number of children: N/A  . Years of education: N/A   Occupational History  . Counselor [employer]   Social History Main Topics  . Smoking status: Former Smoker    Types: Cigarettes  . Smokeless tobacco: Never Used  . Alcohol use No  . Drug use: No  . Sexual activity: Yes    Birth control/ protection: None   Other Topics Concern  . Not on file   Social History Narrative  .  No narrative on file     Physical Exam: BP 112/80 (BP Location: Left Arm, Patient Position: Sitting, Cuff Size: Normal)   Pulse 68   Ht 5' 6.75" (1.695 m) Comment: height measured without shoes  Wt 253 lb 6 oz (114.9 kg)   BMI 39.98 kg/m  Constitutional: generally well-appearing Psychiatric: alert and oriented x3 Abdomen: soft, nontender, nondistended, no obvious ascites, no peritoneal signs, normal bowel sounds No peripheral edema noted in lower extremities  Assessment and plan: 40 y.o. female with Chronic diarrhea, elevated inflammatory markers, family history of colon cancer, family history of Crohn's  disease  First her diarrhea does seem to be improved on the days which she remembers to take her cholestyramine. I strongly recommended that she try her best to get this in her once daily. She is relieved to know she can take it at dinner, at lunch, or at bedtime if needed. Her inflammatory markers were quite elevated and she has a family history of both Crohn's disease and colon cancer. Her last colonoscopy was about 4 years ago and I recommended we repeat that now to look for both Crohn's disease, neoplasm. She does not have either been given her significantly elevated inflammatory markers and her joint pains (feet) I will likely refer her to a rheumatologist to consider rheumatoid arthritis.   Owens Loffler, MD [facility] 09/01/2016, 10:52 AM

## 2016-09-01 NOTE — Patient Instructions (Signed)
Try to take the cholestyramine once daily. You will be set up for a colonoscopy for diarrhea.

## 2016-09-27 ENCOUNTER — Encounter: Payer: Self-pay | Admitting: Gastroenterology

## 2016-10-09 ENCOUNTER — Ambulatory Visit (AMBULATORY_SURGERY_CENTER): Payer: BC Managed Care – PPO | Admitting: Gastroenterology

## 2016-10-09 ENCOUNTER — Encounter: Payer: Self-pay | Admitting: Gastroenterology

## 2016-10-09 VITALS — BP 115/80 | HR 67 | Temp 98.2°F | Resp 17 | Ht 66.75 in | Wt 253.0 lb

## 2016-10-09 DIAGNOSIS — Z800 Family history of malignant neoplasm of digestive organs: Secondary | ICD-10-CM | POA: Diagnosis not present

## 2016-10-09 DIAGNOSIS — R197 Diarrhea, unspecified: Secondary | ICD-10-CM

## 2016-10-09 HISTORY — PX: COLONOSCOPY: SHX174

## 2016-10-09 MED ORDER — SODIUM CHLORIDE 0.9 % IV SOLN: 500.0000 mL | INTRAVENOUS | Status: DC

## 2016-10-09 NOTE — Progress Notes (Signed)
Called to room to assist during endoscopic procedure.  Patient ID and intended procedure confirmed with present staff. Received instructions for my participation in the procedure from the performing physician.  

## 2016-10-09 NOTE — Op Note (Signed)
[facility] Patient Name: Bianca Lee Procedure Date: 10/09/2016 1:57 PM MRN: [id number] Endoscopist: Milus Banister , MD Age: 40 Referring MD:  Date of Birth: [birth year] Gender: Female Account #: [id number] Procedure:                Colonoscopy Indications:              Chronic diarrhea; father had rectal cancer; also FH                            of crohn's disease Medicines:                Monitored Anesthesia Care Procedure:                Pre-Anesthesia Assessment:                           - Prior to the procedure, a History and Physical                            was performed, and patient medications and                            allergies were reviewed. The patient's tolerance of                            previous anesthesia was also reviewed. The risks                            and benefits of the procedure and the sedation                            options and risks were discussed with the patient.                            All questions were answered, and informed consent                            was obtained. Prior Anticoagulants: The patient has                            taken no previous anticoagulant or antiplatelet                            agents. ASA Grade Assessment: II - A patient with                            mild systemic disease. After reviewing the risks                            and benefits, the patient was deemed in                            satisfactory condition to undergo the procedure.  After obtaining informed consent, the colonoscope                            was passed under direct vision. Throughout the                            procedure, the patient's blood pressure, pulse, and                            oxygen saturations were monitored continuously. The                            Model CF-HQ190L [id number]) scope was introduced                            through the anus and advanced to the  the terminal                            ileum. The colonoscopy was performed without                            difficulty. The patient tolerated the procedure                            well. The quality of the bowel preparation was                            excellent. The terminal ileum, ileocecal valve,                            appendiceal orifice, and rectum were photographed. Scope In: 2:06:16 PM Scope Out: 2:13:29 PM Scope Withdrawal Time: 0 hours 6 minutes 1 second  Total Procedure Duration: 0 hours 7 minutes 13 seconds  Findings:                 The terminal ileum appeared normal.                           The entire examined colon appeared normal on direct                            and retroflexion views.                           Biopsies for histology were taken with a cold                            forceps from the entire colon for evaluation of                            microscopic colitis. Complications:            No immediate complications. Estimated blood loss:  None. Estimated Blood Loss:     Estimated blood loss: none. Impression:               - The examined portion of the ileum was normal.                           - The entire examined colon is normal on direct and                            retroflexion views.                           - Biopsies were taken with a cold forceps from the                            entire colon for evaluation of microscopic colitis. Recommendation:           - Patient has a contact number available for                            emergencies. The signs and symptoms of potential                            delayed complications were discussed with the                            patient. Return to normal activities tomorrow.                            Written discharge instructions were provided to the                            patient.                           - Resume previous diet.                            - Continue present medications.                           - Repeat colonoscopy in 5 years for screening                            purposes.                           - Await final pathology, if no colitis then will                            refer you to a rheumatologist to discuss elevated                            sed rate, joint pains, FH of RA. Milus Banister, MD 10/09/2016 2:19:59 PM This report has been signed electronically.

## 2016-10-09 NOTE — Progress Notes (Signed)
To recovery, report to S. Monday, RN, VSS

## 2016-10-09 NOTE — Patient Instructions (Signed)
YOU HAD AN ENDOSCOPIC PROCEDURE TODAY AT [facility]:   Refer to the procedure report that was given to you for any specific questions about what was found during the examination.  If the procedure report does not answer your questions, please call your gastroenterologist to clarify.  If you requested that your care partner not be given the details of your procedure findings, then the procedure report has been included in a sealed envelope for you to review at your convenience later.  YOU SHOULD EXPECT: Some feelings of bloating in the abdomen. Passage of more gas than usual.  Walking can help get rid of the air that was put into your GI tract during the procedure and reduce the bloating. If you had a lower endoscopy (such as a colonoscopy or flexible sigmoidoscopy) you may notice spotting of blood in your stool or on the toilet paper. If you underwent a bowel prep for your procedure, you may not have a normal bowel movement for a few days.  Please Note:  You might notice some irritation and congestion in your nose or some drainage.  This is from the oxygen used during your procedure.  There is no need for concern and it should clear up in a day or so.  SYMPTOMS TO REPORT IMMEDIATELY:   Following lower endoscopy (colonoscopy or flexible sigmoidoscopy):  Excessive amounts of blood in the stool  Significant tenderness or worsening of abdominal pains  Swelling of the abdomen that is new, acute  Fever of 100F or higher   For urgent or emergent issues, a gastroenterologist can be reached at any hour by calling [phone].   DIET:  We do recommend a small meal at first, but then you may proceed to your regular diet.  Drink plenty of fluids but you should avoid alcoholic beverages for 24 hours.  ACTIVITY:  You should plan to take it easy for the rest of today and you should NOT DRIVE or use heavy machinery until tomorrow (because of the sedation medicines used during the test).     FOLLOW UP: Our staff will call the number listed on your records the next business day following your procedure to check on you and address any questions or concerns that you may have regarding the information given to you following your procedure. If we do not reach you, we will leave a message.  However, if you are feeling well and you are not experiencing any problems, there is no need to return our call.  We will assume that you have returned to your regular daily activities without incident.  If any biopsies were taken you will be contacted by phone or by letter within the next 1-3 weeks.  Please call us at [phone] if you have not heard about the biopsies in 3 weeks.    SIGNATURES/CONFIDENTIALITY: You and/or your care partner have signed paperwork which will be entered into your electronic medical record.  These signatures attest to the fact that that the information above on your After Visit Summary has been reviewed and is understood.  Full responsibility of the confidentiality of this discharge information lies with you and/or your care-partner.  Thank  You for letting us take care of your healthcare needs today.

## 2016-10-10 ENCOUNTER — Telehealth: Payer: Self-pay

## 2016-10-10 NOTE — Telephone Encounter (Signed)
  Follow up Call-  Call back number 10/09/2016  Post procedure Call Back phone  # [phone]  Permission to leave phone message Yes  Some recent data might be hidden     Patient questions:  Do you have a fever, pain , or abdominal swelling? No. Pain Score  0 *  Have you tolerated food without any problems? Yes.    Have you been able to return to your normal activities? Yes.    Do you have any questions about your discharge instructions: Diet   No. Medications  No. Follow up visit  No.  Do you have questions or concerns about your Care? No.  Actions: * If pain score is 4 or above: No action needed, pain <4.

## 2016-10-19 ENCOUNTER — Other Ambulatory Visit: Payer: Self-pay

## 2016-10-19 DIAGNOSIS — M069 Rheumatoid arthritis, unspecified: Secondary | ICD-10-CM

## 2016-11-28 LAB — HM PAP SMEAR: HM PAP: NORMAL

## 2017-07-23 ENCOUNTER — Other Ambulatory Visit: Payer: Self-pay | Admitting: Gastroenterology

## 2017-08-21 ENCOUNTER — Other Ambulatory Visit: Payer: Self-pay | Admitting: Gastroenterology

## 2017-09-14 ENCOUNTER — Other Ambulatory Visit: Payer: Self-pay

## 2017-09-14 MED ORDER — CHOLESTYRAMINE 4 G PO PACK: PACK | ORAL | 3 refills | Status: DC

## 2017-10-12 ENCOUNTER — Ambulatory Visit: Payer: BC Managed Care – PPO | Admitting: Gastroenterology

## 2018-01-08 LAB — HM PAP SMEAR: HM PAP: NORMAL

## 2018-01-30 LAB — BASIC METABOLIC PANEL
BUN: 13 (ref 4–21)
Creatinine: 0.8 (ref 0.5–1.1)
GLUCOSE: 110
Potassium: 4 (ref 3.4–5.3)
SODIUM: 136 — AB (ref 137–147)

## 2018-01-30 LAB — LIPID PANEL
CHOLESTEROL: 177 (ref 0–200)
HDL: 35 (ref 35–70)
LDL CALC: 114
Triglycerides: 162 — AB (ref 40–160)

## 2018-01-30 LAB — HEPATIC FUNCTION PANEL
ALT: 21 (ref 7–35)
AST: 18 (ref 13–35)
Alkaline Phosphatase: 74 (ref 25–125)
Bilirubin, Total: 0.3

## 2018-01-30 LAB — HEMOGLOBIN A1C: Hemoglobin A1C: 6.1

## 2018-01-30 LAB — TSH: TSH: 1.94 (ref 0.41–5.90)

## 2018-10-02 ENCOUNTER — Ambulatory Visit: Payer: BC Managed Care – PPO | Admitting: Family Medicine

## 2018-10-02 ENCOUNTER — Encounter: Payer: Self-pay | Admitting: Family Medicine

## 2018-10-02 VITALS — BP 120/80 | HR 85 | Temp 98.5°F | Ht 69.0 in | Wt 260.6 lb

## 2018-10-02 DIAGNOSIS — F4323 Adjustment disorder with mixed anxiety and depressed mood: Secondary | ICD-10-CM

## 2018-10-02 DIAGNOSIS — R700 Elevated erythrocyte sedimentation rate: Secondary | ICD-10-CM | POA: Diagnosis not present

## 2018-10-02 DIAGNOSIS — M19071 Primary osteoarthritis, right ankle and foot: Secondary | ICD-10-CM

## 2018-10-02 DIAGNOSIS — Z85828 Personal history of other malignant neoplasm of skin: Secondary | ICD-10-CM

## 2018-10-02 DIAGNOSIS — Z800 Family history of malignant neoplasm of digestive organs: Secondary | ICD-10-CM

## 2018-10-02 DIAGNOSIS — K591 Functional diarrhea: Secondary | ICD-10-CM

## 2018-10-02 DIAGNOSIS — M19072 Primary osteoarthritis, left ankle and foot: Secondary | ICD-10-CM

## 2018-10-02 DIAGNOSIS — R7982 Elevated C-reactive protein (CRP): Secondary | ICD-10-CM

## 2018-10-02 DIAGNOSIS — D6851 Activated protein C resistance: Secondary | ICD-10-CM

## 2018-10-02 DIAGNOSIS — R7303 Prediabetes: Secondary | ICD-10-CM

## 2018-10-02 LAB — CHLORIDE
CHLORIDE: 102
CO2: 28
TOTAL PROTEIN: 6.8 g/dL

## 2018-10-02 MED ORDER — COLESTIPOL HCL 1 G PO TABS: 1.0000 g | ORAL_TABLET | Freq: Two times a day (BID) | ORAL | 3 refills | Status: DC

## 2018-10-02 NOTE — Progress Notes (Signed)
Patient: Bianca Lee, Female    DOB: 12-27-1975, 42 y.o.   MRN: [id number] Visit Date: 10/02/2018  Today's Provider: Lavon Paganini, MD   Chief Complaint  Patient presents with  . Establish Care   Subjective:  I, Tiburcio Pea, CMA, am acting as a scribe for Lavon Paganini, MD.   New Patient: Bianca Lee is a 42 year old female who presents today to [place] as a new patient. She reports feeling well. She is exercising. She states she is sleeping. Patient states she was seeing Dr. Hall Busing several years ago.   Patient would like to discuss recent labs she had done at [facility].  Cholesterol slightly elevated.  A1c 6.1 (prediabetes).  She is concerned because dad was recently diagnosed with diabetes and has diabetic ulcer  Patient reports having pap done in February 2019 by Dr. Gaetano Net at [facility]. Will request records.  Zoloft was prescribed by Gyn after husband left with tennis partner when daughter was 58 wks old as he didn't want to be a father or husband.  Has been doing well on it ever since.  She feels stable on it.  She denies any side effects.  Was previously seeing [facility] (last in 2017).  ESR And CRP were very high at that time.  She was referred to Rheum to look for RA and that was negative.  Mostly has pain in bilateral feet (1st MTP joint).  Both parents have a lot of arthritis - seemingly OA by description.  She was being seen by GI for family history of rectal cancer and diarrhea.  Her colonoscopy was normal.  She was given cholestyramine to take 4 times daily to help with her diarrhea.  She is taking this mostly in the mornings.  She is status post cholecystectomy.  She was wondering if this medication could be harmful long-term as she read that it is for cholesterol.  She finds that high fat intake and stress her aggravators for her diarrhea.  She will have intermittent episodes of loose stools, but sometimes stools are formed.  It is better  than it was previously.  Her dad has something similar and is taking colestipol.  H/o basal cell carcinoma on forehead.  Sees Dr Nehemiah Massed - typically annually, but overdue for a skin check.  She has not noticed any new lesions or changes in skin.  Has known heterozygous Factor 5 Leiden.  No h/o VTE.  Father has Factor 5 Leiden and h/o VTE.  Did Lovenox after delivery of child.  History of asthma in childhood.  She has not needed any medication since high school.  No previous hospitalizations or intubation.  No recent exacerbations.  _________________________________________________________________________________________  Review of Systems  Constitutional: Negative.   HENT: Negative.   Eyes: Negative.   Respiratory: Negative.   Cardiovascular: Negative.   Gastrointestinal: Positive for diarrhea. Negative for abdominal distention, abdominal pain, anal bleeding, blood in stool, constipation, nausea, rectal pain and vomiting.  Endocrine: Negative.   Genitourinary: Negative.   Musculoskeletal: Positive for arthralgias and joint swelling. Negative for back pain, gait problem, myalgias, neck pain and neck stiffness.  Skin: Negative.   Allergic/Immunologic: Negative.   Neurological: Negative.   Hematological: Negative.   Psychiatric/Behavioral: Negative.     Social History      She  reports that she has quit smoking. Her smoking use included cigarettes. She has never used smokeless tobacco. She reports that she does not drink alcohol or use  drugs.       Social History   Socioeconomic History  . Marital status: Divorced    Spouse name: Not on file  . Number of children: Not on file  . Years of education: Not on file  . Highest education level: Not on file  Occupational History  . Occupation: Nurse, adult: [employer]  . Financial resource strain: Not on file  . Food insecurity:    Worry: Not on file    Inability: Not on file  . Transportation  needs:    Medical: Not on file    Non-medical: Not on file  Tobacco Use  . Smoking status: Former Smoker    Types: Cigarettes  . Smokeless tobacco: Never Used  Substance and Sexual Activity  . Alcohol use: No    Alcohol/week: 0.0 standard drinks  . Drug use: No  . Sexual activity: Yes    Birth control/protection: None  Lifestyle  . Physical activity:    Days per week: Not on file    Minutes per session: Not on file  . Stress: Not on file  Relationships  . Social connections:    Talks on phone: Not on file    Gets together: Not on file    Attends religious service: Not on file    Active member of club or organization: Not on file    Attends meetings of clubs or organizations: Not on file    Relationship status: Not on file  Other Topics Concern  . Not on file  Social History Narrative  . Not on file    Past Medical History:  Diagnosis Date  . Arthritis   . Asthma    Childhood  . Cancer ([place])    basal cell carcinoma  . Clotting disorder ([place])   . Factor 5 Leiden mutation, heterozygous [facility])      Patient Active Problem List   Diagnosis Date Noted  . Diarrhea 06/28/2016  . Generalized abdominal pain 06/28/2016  . Indication for care in labor or delivery 07/30/2014  . Family history of rectal cancer 09/02/2012    Past Surgical History:  Procedure Laterality Date  . CESAREAN SECTION N/A 08/01/2014   Procedure: CESAREAN SECTION;  Surgeon: Allena Katz, MD;  Location: [facility];  Service: Obstetrics;  Laterality: N/A;  arrest of descent  . CHOLECYSTECTOMY    . TONSILLECTOMY      Family History        Family Status  Relation Name Status  . Father  (Not Specified)  . Annamarie Major  (Not Specified)  . Sister  (Not Specified)  . Mat Aunt  (Not Specified)  . Ethlyn Daniels  (Not Specified)  . MGF  (Not Specified)  . Mother  (Not Specified)  . MGM  (Not Specified)  . Mat Uncle  (Not Specified)        Her family history includes Arthritis in her maternal aunt and  mother; COPD in her maternal grandmother; Cancer in her paternal aunt; Cataracts in her maternal grandmother and mother; Clotting disorder in her father and sister; Crohn's disease in her maternal aunt; Diabetes in her father and paternal aunt; Heart disease in her maternal grandfather and maternal uncle; Hypertension in her mother; Irritable bowel syndrome in her maternal aunt; Lung cancer in her maternal grandmother; Prostate cancer in her paternal uncle; Rectal cancer in her father and paternal uncle; Skin cancer in her mother; Stroke in her maternal grandmother and paternal aunt.  Allergies  Allergen Reactions  . Morphine And Related Other (See Comments)    Pt states that this medication gave her a headache.      Current Outpatient Medications:  .  cholestyramine (QUESTRAN) 4 g packet, TAKE 1 PACK , MIX WITH JUICE OR WATER, DAILY., Disp: 90 packet, Rfl: 3 .  diphenhydrAMINE (BENADRYL) 25 MG tablet, Take 25 mg by mouth every evening., Disp: , Rfl:  .  sertraline (ZOLOFT) 50 MG tablet, Take 50 mg by mouth daily., Disp: , Rfl:  .  hyoscyamine (LEVSIN/SL) 0.125 MG SL tablet, Place 1 tablet (0.125 mg total) under the tongue every 6 (six) hours as needed. (Patient not taking: Reported on 10/09/2016), Disp: 20 tablet, Rfl: 1  Current Facility-Administered Medications:  .  0.9 %  sodium chloride infusion, 500 mL, Intravenous, Continuous, Milus Banister, MD   Patient Care Team: Virginia Crews, MD as PCP - General (Family Medicine)      Objective:   Vitals: BP 120/80 (BP Location: Right Arm, Patient Position: Sitting, Cuff Size: Large)   Pulse 85   Temp 98.5 F (36.9 C) (Oral)   Ht 5' 9"  (1.753 m)   Wt 260 lb 9.6 oz (118.2 kg)   SpO2 97%   BMI 38.48 kg/m    Vitals:   10/02/18 1525  BP: 120/80  Pulse: 85  Temp: 98.5 F (36.9 C)  TempSrc: Oral  SpO2: 97%  Weight: 260 lb 9.6 oz (118.2 kg)  Height: 5' 9"  (1.753 m)     Physical Exam  Constitutional: She is oriented to  person, place, and time. She appears well-developed and well-nourished. No distress.  HENT:  Head: Normocephalic and atraumatic.  Right Ear: External ear normal.  Left Ear: External ear normal.  Nose: Nose normal.  Mouth/Throat: Oropharynx is clear and moist.  Eyes: Pupils are equal, round, and reactive to light. Conjunctivae and EOM are normal. No scleral icterus.  Neck: Neck supple. No thyromegaly present.  Cardiovascular: Normal rate, regular rhythm, normal heart sounds and intact distal pulses.  No murmur heard. Pulmonary/Chest: Effort normal and breath sounds normal. No respiratory distress. She has no wheezes. She has no rales.  Abdominal: Soft. Bowel sounds are normal. She exhibits no distension. There is no tenderness. There is no rebound and no guarding.  Musculoskeletal: She exhibits no edema or deformity.  Mild tenderness to palpation at bilateral first MTP joints.  No swelling or erythema.  Lymphadenopathy:    She has no cervical adenopathy.  Neurological: She is alert and oriented to person, place, and time.  Skin: Skin is warm and dry. Capillary refill takes less than 2 seconds. No rash noted.  Psychiatric: She has a normal mood and affect. Her behavior is normal.  Vitals reviewed.    Depression Screen PHQ 2/9 Scores 10/02/2018  PHQ - 2 Score 0  PHQ- 9 Score 3    Assessment & Plan:     Establish care  Exercise Activities and Dietary recommendations Goals   None     Immunization History  Administered Date(s) Administered  . Influenza-Unspecified 09/25/2018    Health Maintenance  Topic Date Due  . TETANUS/TDAP  09/09/1996  . PAP SMEAR  09/17/2017  . COLONOSCOPY  10/09/2021  . INFLUENZA VACCINE  Completed  . HIV Screening  Completed     Discussed health benefits of physical activity, and encouraged her to engage in regular exercise appropriate for her age and condition.     --------------------------------------------------------------------  Problem List Items Addressed This  Visit      Musculoskeletal and Integument   History of basal cell carcinoma (BCC)    Followed annually by dermatology for skin checks Encouraged to schedule an appointment to catch up on this No suspicious lesions noted today      Osteoarthritis    Pain and swelling of bilateral first MTPs is likely secondary to osteoarthritis Discussed wearing shoes with more support and larger and wider toe box and avoiding high heels Can use anti-inflammatories as needed        Hematopoietic and Hemostatic   Heterozygous factor V Leiden mutation ([place])    Need to consider if patient has surgery or another pregnancy        Other   Family history of rectal cancer    Previously followed by GI Recent colonoscopy within normal limits Screening colonoscopy every 5 years      Diarrhea - Primary    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 Has had normal work-up from GI, including normal colonoscopy Will try colestipol Can consider imodium or any IBS-D meds in the future      Prediabetes    Will abstract GYN labs New diagnosis Will recheck A1c Advised on weight loss, low carb diet, and exercise Discussed risk of progression to diabetes and diabetes complications      Relevant Orders   Hemoglobin A1c (Completed)   Adjustment reaction with anxiety and depression    Well controlled Continue zoloft 76m daily       Other Visit Diagnoses    Elevated sed rate       Relevant Orders   Sed Rate (ESR) (Completed)   Elevated C-reactive protein (CRP)       Relevant Orders   C-reactive protein (Completed)    - recheck CRP and ESR to see if they have decreased - discussed that these tests are non-specific   Return in about 4 months (around 02/02/2019) for CPE.   The entirety of the information documented in the History of Present Illness, Review of Systems and Physical Exam  were personally obtained by me. Portions of this information were initially documented by NTiburcio Pea CMA and reviewed by me for thoroughness and accuracy.    BVirginia Crews MD, MPH [facility]10/18/2019 12:39 PM

## 2018-10-02 NOTE — Patient Instructions (Signed)

## 2018-10-03 ENCOUNTER — Telehealth: Payer: Self-pay

## 2018-10-03 LAB — HEMOGLOBIN A1C
Est. average glucose Bld gHb Est-mCnc: 123 mg/dL
Hgb A1c MFr Bld: 5.9 % — ABNORMAL HIGH (ref 4.8–5.6)

## 2018-10-03 LAB — SEDIMENTATION RATE: SED RATE: 16 mm/h (ref 0–32)

## 2018-10-03 LAB — C-REACTIVE PROTEIN: CRP: 11 mg/L — AB (ref 0–10)

## 2018-10-03 NOTE — Telephone Encounter (Signed)
LMTCB

## 2018-10-03 NOTE — Telephone Encounter (Signed)
-----  Message from Virginia Crews, MD sent at 10/03/2018  1:52 PM EDT ----- ESR and CRP have decreased significantly and are nearly normal.  A1c has decreased slightly from 6.1 to 5.9.  This is still in the pre-diabetic range, so exercise and low carb diet (as discussed) are important  Bacigalupo, Dionne Bucy, MD, MPH [facility] 10/03/2018 1:52 PM

## 2018-10-04 DIAGNOSIS — M1990 Unspecified osteoarthritis, unspecified site: Secondary | ICD-10-CM | POA: Insufficient documentation

## 2018-10-04 DIAGNOSIS — F4323 Adjustment disorder with mixed anxiety and depressed mood: Secondary | ICD-10-CM | POA: Insufficient documentation

## 2018-10-04 DIAGNOSIS — D6851 Activated protein C resistance: Secondary | ICD-10-CM | POA: Insufficient documentation

## 2018-10-04 DIAGNOSIS — Z85828 Personal history of other malignant neoplasm of skin: Secondary | ICD-10-CM | POA: Insufficient documentation

## 2018-10-04 NOTE — Assessment & Plan Note (Signed)
Well controlled Continue zoloft 50mg  daily

## 2018-10-04 NOTE — Assessment & Plan Note (Signed)
Followed annually by dermatology for skin checks Encouraged to schedule an appointment to catch up on this No suspicious lesions noted today

## 2018-10-04 NOTE — Assessment & Plan Note (Signed)
Will abstract GYN labs New diagnosis Will recheck A1c Advised on weight loss, low carb diet, and exercise Discussed risk of progression to diabetes and diabetes complications

## 2018-10-04 NOTE — Assessment & Plan Note (Signed)
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

## 2018-10-04 NOTE — Assessment & Plan Note (Signed)
Pain and swelling of bilateral first MTPs is likely secondary to osteoarthritis Discussed wearing shoes with more support and larger and wider toe box and avoiding high heels Can use anti-inflammatories as needed

## 2018-10-04 NOTE — Assessment & Plan Note (Signed)
Need to consider if patient has surgery or another pregnancy

## 2018-10-04 NOTE — Assessment & Plan Note (Signed)
Previously followed by GI Recent colonoscopy within normal limits Screening colonoscopy every 5 years

## 2018-10-07 NOTE — Telephone Encounter (Signed)
Patient advised.

## 2018-10-16 ENCOUNTER — Encounter: Payer: Self-pay | Admitting: Family Medicine

## 2019-02-05 ENCOUNTER — Encounter: Payer: Self-pay | Admitting: Family Medicine

## 2019-02-05 ENCOUNTER — Ambulatory Visit (INDEPENDENT_AMBULATORY_CARE_PROVIDER_SITE_OTHER): Payer: BC Managed Care – PPO | Admitting: Family Medicine

## 2019-02-05 VITALS — BP 119/86 | HR 77 | Temp 98.4°F | Ht 69.0 in | Wt 256.8 lb

## 2019-02-05 DIAGNOSIS — Z6837 Body mass index (BMI) 37.0-37.9, adult: Secondary | ICD-10-CM | POA: Diagnosis not present

## 2019-02-05 DIAGNOSIS — Z0000 Encounter for general adult medical examination without abnormal findings: Secondary | ICD-10-CM | POA: Diagnosis not present

## 2019-02-05 DIAGNOSIS — R7303 Prediabetes: Secondary | ICD-10-CM | POA: Diagnosis not present

## 2019-02-05 DIAGNOSIS — E669 Obesity, unspecified: Secondary | ICD-10-CM

## 2019-02-05 DIAGNOSIS — E66812 Obesity, class 2: Secondary | ICD-10-CM

## 2019-02-05 NOTE — Assessment & Plan Note (Signed)
Discussed importance of healthy weight management Discussed diet and exercise Discussed IF

## 2019-02-05 NOTE — Progress Notes (Signed)
Patient: Bianca Lee, Female    DOB: [birth year], 43 y.o.   MRN: [id number] Visit Date: 02/05/2019  Today's Provider: Lavon Paganini, MD   Chief Complaint  Patient presents with  . Annual Exam   Subjective:    I, Tiburcio Pea, CMA, am acting as a scribe for Lavon Paganini, MD.    Annual physical exam Bianca Lee is a 43 y.o. female who presents today for health maintenance and complete physical. She feels well. She reports exercising lightly 1 day per week. She reports she is sleeping fairly well.  Joined weight watchers and making better food choices.  Struggling with tracking foods daily. -----------------------------------------------------------------   Review of Systems  Constitutional: Negative.   HENT: Negative.   Eyes: Negative.   Respiratory: Negative.   Cardiovascular: Negative.   Gastrointestinal: Negative.   Endocrine: Negative.   Genitourinary: Negative.   Musculoskeletal: Negative.   Skin: Negative.   Allergic/Immunologic: Negative.   Neurological: Negative.   Hematological: Negative.   Psychiatric/Behavioral: Negative.     Social History      She  reports that she quit smoking about 22 years ago. Her smoking use included cigarettes. She has a 1.00 pack-year smoking history. She has never used smokeless tobacco. She reports that she does not drink alcohol or use drugs.       Social History   Socioeconomic History  . Marital status: Divorced    Spouse name: Not on file  . Number of children: 1  . Years of education: Not on file  . Highest education level: Not on file  Occupational History  . Occupation: high Animal nutritionist  Social Needs  . Financial resource strain: Not on file  . Food insecurity:    Worry: Not on file    Inability: Not on file  . Transportation needs:    Medical: Not on file    Non-medical: Not on file  Tobacco Use  . Smoking status: Former Smoker    Packs/day: 0.25    Years: 4.00    Pack years: 1.00     Types: Cigarettes    Last attempt to quit: 1998    Years since quitting: 22.1  . Smokeless tobacco: Never Used  Substance and Sexual Activity  . Alcohol use: No    Alcohol/week: 0.0 standard drinks  . Drug use: No  . Sexual activity: Yes    Partners: Male    Birth control/protection: I.U.D.  Lifestyle  . Physical activity:    Days per week: Not on file    Minutes per session: Not on file  . Stress: Not on file  Relationships  . Social connections:    Talks on phone: Not on file    Gets together: Not on file    Attends religious service: Not on file    Active member of club or organization: Not on file    Attends meetings of clubs or organizations: Not on file    Relationship status: Not on file  Other Topics Concern  . Not on file  Social History Narrative  . Not on file    Past Medical History:  Diagnosis Date  . Arthritis   . Asthma    Childhood  . Cancer ([place])    basal cell carcinoma  . Clotting disorder ([place])   . Factor 5 Leiden mutation, heterozygous ([place])   . Foot fracture      Patient Active Problem List   Diagnosis Date Noted  . History of  basal cell carcinoma (BCC) 10/04/2018  . Adjustment reaction with anxiety and depression 10/04/2018  . Osteoarthritis 10/04/2018  . Heterozygous factor V Leiden mutation ([place]) 10/04/2018  . Prediabetes 10/02/2018  . Diarrhea 06/28/2016  . Family history of rectal cancer 09/02/2012    Past Surgical History:  Procedure Laterality Date  . CESAREAN SECTION N/A 08/01/2014   Procedure: CESAREAN SECTION;  Surgeon: Allena Katz, MD;  Location: [facility];  Service: Obstetrics;  Laterality: N/A;  arrest of descent  . CHOLECYSTECTOMY    . TONSILLECTOMY      Family History        Family Status  Relation Name Status  . Father  (Not Specified)  . Annamarie Major  (Not Specified)  . Sister  (Not Specified)  . Mat Aunt  (Not Specified)  . Ethlyn Daniels  (Not Specified)  . MGF  (Not Specified)  . Mother  (Not Specified)  .  MGM  (Not Specified)  . Mat Uncle  (Not Specified)  . PGM  (Not Specified)        Her family history includes Arthritis in her maternal aunt and mother; COPD in her maternal grandmother; Cancer in her paternal aunt; Cataracts in her maternal grandmother and mother; Clotting disorder in her father and sister; Crohn's disease in her maternal aunt; Diabetes in her father and paternal aunt; Heart disease in her maternal grandfather and maternal uncle; Hypertension in her mother; Irritable bowel syndrome in her maternal aunt; Lung cancer in her maternal grandmother; Prostate cancer in her paternal uncle; Rectal cancer in her paternal uncle; Rectal cancer (age of onset: 45) in her father; Rheum arthritis in her paternal grandmother; Skin cancer in her mother; Stroke in her maternal grandmother and paternal aunt.      Allergies  Allergen Reactions  . Morphine And Related Other (See Comments)    Pt states that this medication gave her a headache.      Current Outpatient Medications:  .  colestipol (COLESTID) 1 g tablet, Take 1 tablet (1 g total) by mouth 2 (two) times daily., Disp: 60 tablet, Rfl: 3 .  diphenhydrAMINE (BENADRYL) 25 MG tablet, Take 25 mg by mouth every evening., Disp: , Rfl:  .  sertraline (ZOLOFT) 50 MG tablet, Take 50 mg by mouth daily., Disp: , Rfl:    Patient Care Team: Virginia Crews, MD as PCP - General (Family Medicine)    Objective:    Vitals: BP 119/86 (BP Location: Right Arm, Patient Position: Sitting, Cuff Size: Large)   Pulse 77   Temp 98.4 F (36.9 C) (Oral)   Ht 5\' 9"  (1.753 m)   Wt 256 lb 12.8 oz (116.5 kg)   SpO2 99%   BMI 37.92 kg/m    Vitals:   02/05/19 1518  BP: 119/86  Pulse: 77  Temp: 98.4 F (36.9 C)  TempSrc: Oral  SpO2: 99%  Weight: 256 lb 12.8 oz (116.5 kg)  Height: 5\' 9"  (1.753 m)     Physical Exam Vitals signs reviewed.  Constitutional:      General: She is not in acute distress.    Appearance: She is well-developed. She is  obese. She is not diaphoretic.  HENT:     Head: Normocephalic and atraumatic.     Right Ear: Tympanic membrane, ear canal and external ear normal.     Left Ear: Tympanic membrane, ear canal and external ear normal.     Nose: Nose normal.     Mouth/Throat:  Mouth: Mucous membranes are moist.     Pharynx: Oropharynx is clear. No oropharyngeal exudate.  Eyes:     General: No scleral icterus.    Conjunctiva/sclera: Conjunctivae normal.     Pupils: Pupils are equal, round, and reactive to light.  Neck:     Musculoskeletal: Neck supple.     Thyroid: No thyromegaly.  Cardiovascular:     Rate and Rhythm: Normal rate and regular rhythm.     Pulses: Normal pulses.     Heart sounds: Normal heart sounds. No murmur.  Pulmonary:     Effort: Pulmonary effort is normal. No respiratory distress.     Breath sounds: Normal breath sounds. No wheezing or rales.  Abdominal:     General: Bowel sounds are normal. There is no distension.     Palpations: Abdomen is soft.     Tenderness: There is no abdominal tenderness. There is no guarding or rebound.  Musculoskeletal:        General: No deformity.     Right lower leg: No edema.     Left lower leg: No edema.  Lymphadenopathy:     Cervical: No cervical adenopathy.  Skin:    General: Skin is warm and dry.     Capillary Refill: Capillary refill takes less than 2 seconds.     Findings: No rash.  Neurological:     Mental Status: She is alert and oriented to person, place, and time.  Psychiatric:        Mood and Affect: Mood normal.        Behavior: Behavior normal.        Thought Content: Thought content normal.      Depression Screen PHQ 2/9 Scores 10/02/2018  PHQ - 2 Score 0  PHQ- 9 Score 3       Assessment & Plan:     Routine Health Maintenance and Physical Exam  Exercise Activities and Dietary recommendations Goals   None     Immunization History  Administered Date(s) Administered  . Influenza-Unspecified 09/25/2018  .  Tdap 04/28/2014    Health Maintenance  Topic Date Due  . PAP SMEAR-Modifier  01/08/2021  . COLONOSCOPY  10/09/2021  . TETANUS/TDAP  04/28/2024  . INFLUENZA VACCINE  Completed  . HIV Screening  Completed     Discussed health benefits of physical activity, and encouraged her to engage in regular exercise appropriate for her age and condition.    --------------------------------------------------------------------   Problem List Items Addressed This Visit      Other   Prediabetes    Recheck A1c Discussed diet and exercise      Relevant Orders   Hemoglobin A1c   Obesity    Discussed importance of healthy weight management Discussed diet and exercise Discussed IF       Other Visit Diagnoses    Encounter for annual physical exam    -  Primary   Relevant Orders   Comprehensive metabolic panel   Hemoglobin A1c   Lipid panel   CBC w/Diff/Platelet       Return in about 6 months (around 08/06/2019) for chronic disease f/u.   The entirety of the information documented in the History of Present Illness, Review of Systems and Physical Exam were personally obtained by me. Portions of this information were initially documented by Tiburcio Pea, CMA and reviewed by me for thoroughness and accuracy.    Virginia Crews, MD, MPH [facility] 02/05/2019 5:02 PM

## 2019-02-05 NOTE — Patient Instructions (Signed)
Look into intermittent fasting   Preventive Care 40-64 Years, Female Preventive care refers to lifestyle choices and visits with your health care provider that can promote health and wellness. What does preventive care include?   A yearly physical exam. This is also called an annual well check.  Dental exams once or twice a year.  Routine eye exams. Ask your health care provider how often you should have your eyes checked.  Personal lifestyle choices, including: ? Daily care of your teeth and gums. ? Regular physical activity. ? Eating a healthy diet. ? Avoiding tobacco and drug use. ? Limiting alcohol use. ? Practicing safe sex. ? Taking low-dose aspirin daily starting at age 65. ? Taking vitamin and mineral supplements as recommended by your health care provider. What happens during an annual well check? The services and screenings done by your health care provider during your annual well check will depend on your age, overall health, lifestyle risk factors, and family history of disease. Counseling Your health care provider may ask you questions about your:  Alcohol use.  Tobacco use.  Drug use.  Emotional well-being.  Home and relationship well-being.  Sexual activity.  Eating habits.  Work and work Statistician.  Method of birth control.  Menstrual cycle.  Pregnancy history. Screening You may have the following tests or measurements:  Height, weight, and BMI.  Blood pressure.  Lipid and cholesterol levels. These may be checked every 5 years, or more frequently if you are over 27 years old.  Skin check.  Lung cancer screening. You may have this screening every year starting at age 41 if you have a 30-pack-year history of smoking and currently smoke or have quit within the past 15 years.  Colorectal cancer screening. All adults should have this screening starting at age 26 and continuing until age 29. Your health care provider may recommend screening at  age 84. You will have tests every 1-10 years, depending on your results and the type of screening test. People at increased risk should start screening at an earlier age. Screening tests may include: ? Guaiac-based fecal occult blood testing. ? Fecal immunochemical test (FIT). ? Stool DNA test. ? Virtual colonoscopy. ? Sigmoidoscopy. During this test, a flexible tube with a tiny camera (sigmoidoscope) is used to examine your rectum and lower colon. The sigmoidoscope is inserted through your anus into your rectum and lower colon. ? Colonoscopy. During this test, a long, thin, flexible tube with a tiny camera (colonoscope) is used to examine your entire colon and rectum.  Hepatitis C blood test.  Hepatitis B blood test.  Sexually transmitted disease (STD) testing.  Diabetes screening. This is done by checking your blood sugar (glucose) after you have not eaten for a while (fasting). You may have this done every 1-3 years.  Mammogram. This may be done every 1-2 years. Talk to your health care provider about when you should start having regular mammograms. This may depend on whether you have a family history of breast cancer.  BRCA-related cancer screening. This may be done if you have a family history of breast, ovarian, tubal, or peritoneal cancers.  Pelvic exam and Pap test. This may be done every 3 years starting at age 60. Starting at age 52, this may be done every 5 years if you have a Pap test in combination with an HPV test.  Bone density scan. This is done to screen for osteoporosis. You may have this scan if you are at high risk for osteoporosis.  Discuss your test results, treatment options, and if necessary, the need for more tests with your health care provider. Vaccines Your health care provider may recommend certain vaccines, such as:  Influenza vaccine. This is recommended every year.  Tetanus, diphtheria, and acellular pertussis (Tdap, Td) vaccine. You may need a Td booster  every 10 years.  Varicella vaccine. You may need this if you have not been vaccinated.  Zoster vaccine. You may need this after age 36.  Measles, mumps, and rubella (MMR) vaccine. You may need at least one dose of MMR if you were born in 1957 or later. You may also need a second dose.  Pneumococcal 13-valent conjugate (PCV13) vaccine. You may need this if you have certain conditions and were not previously vaccinated.  Pneumococcal polysaccharide (PPSV23) vaccine. You may need one or two doses if you smoke cigarettes or if you have certain conditions.  Meningococcal vaccine. You may need this if you have certain conditions.  Hepatitis A vaccine. You may need this if you have certain conditions or if you travel or work in places where you may be exposed to hepatitis A.  Hepatitis B vaccine. You may need this if you have certain conditions or if you travel or work in places where you may be exposed to hepatitis B.  Haemophilus influenzae type b (Hib) vaccine. You may need this if you have certain conditions. Talk to your health care provider about which screenings and vaccines you need and how often you need them. This information is not intended to replace advice given to you by your health care provider. Make sure you discuss any questions you have with your health care provider. Document Released: 12/31/2015 Document Revised: 01/24/2018 Document Reviewed: 10/05/2015 Elsevier Interactive Patient Education  2019 [employer].

## 2019-02-05 NOTE — Assessment & Plan Note (Signed)
Recheck A1c Discussed diet and exercise

## 2019-02-07 ENCOUNTER — Telehealth: Payer: Self-pay

## 2019-02-07 LAB — CBC WITH DIFFERENTIAL/PLATELET
BASOS: 1 %
Basophils Absolute: 0.1 10*3/uL (ref 0.0–0.2)
EOS (ABSOLUTE): 0.1 10*3/uL (ref 0.0–0.4)
EOS: 2 %
HEMATOCRIT: 37.9 % (ref 34.0–46.6)
HEMOGLOBIN: 12.3 g/dL (ref 11.1–15.9)
IMMATURE GRANS (ABS): 0 10*3/uL (ref 0.0–0.1)
Immature Granulocytes: 0 %
Lymphocytes Absolute: 1.9 10*3/uL (ref 0.7–3.1)
Lymphs: 31 %
MCH: 26.2 pg — ABNORMAL LOW (ref 26.6–33.0)
MCHC: 32.5 g/dL (ref 31.5–35.7)
MCV: 81 fL (ref 79–97)
MONOCYTES: 6 %
MONOS ABS: 0.4 10*3/uL (ref 0.1–0.9)
NEUTROS PCT: 60 %
Neutrophils Absolute: 3.7 10*3/uL (ref 1.4–7.0)
Platelets: 269 10*3/uL (ref 150–450)
RBC: 4.69 x10E6/uL (ref 3.77–5.28)
RDW: 14.5 % (ref 11.7–15.4)
WBC: 6.2 10*3/uL (ref 3.4–10.8)

## 2019-02-07 LAB — COMPREHENSIVE METABOLIC PANEL
ALT: 12 IU/L (ref 0–32)
AST: 13 IU/L (ref 0–40)
Albumin/Globulin Ratio: 1.5 (ref 1.2–2.2)
Albumin: 4 g/dL (ref 3.8–4.8)
Alkaline Phosphatase: 82 IU/L (ref 39–117)
BUN/Creatinine Ratio: 13 (ref 9–23)
BUN: 10 mg/dL (ref 6–24)
Bilirubin Total: 0.2 mg/dL (ref 0.0–1.2)
CO2: 23 mmol/L (ref 20–29)
CREATININE: 0.79 mg/dL (ref 0.57–1.00)
Calcium: 8.9 mg/dL (ref 8.7–10.2)
Chloride: 101 mmol/L (ref 96–106)
GFR calc Af Amer: 108 mL/min/{1.73_m2} (ref >59)
GFR, EST NON AFRICAN AMERICAN: 93 mL/min/{1.73_m2} (ref >59)
Globulin, Total: 2.7 g/dL (ref 1.5–4.5)
Glucose: 102 mg/dL — ABNORMAL HIGH (ref 65–99)
POTASSIUM: 4.3 mmol/L (ref 3.5–5.2)
Sodium: 139 mmol/L (ref 134–144)
Total Protein: 6.7 g/dL (ref 6.0–8.5)

## 2019-02-07 LAB — LIPID PANEL
CHOLESTEROL TOTAL: 167 mg/dL (ref 100–199)
Chol/HDL Ratio: 5.2 ratio — ABNORMAL HIGH (ref 0.0–4.4)
HDL: 32 mg/dL — AB (ref >39)
LDL Calculated: 112 mg/dL — ABNORMAL HIGH (ref 0–99)
Triglycerides: 116 mg/dL (ref 0–149)
VLDL CHOLESTEROL CAL: 23 mg/dL (ref 5–40)

## 2019-02-07 LAB — HEMOGLOBIN A1C
ESTIMATED AVERAGE GLUCOSE: 126 mg/dL
HEMOGLOBIN A1C: 6 % — AB (ref 4.8–5.6)

## 2019-02-07 NOTE — Telephone Encounter (Signed)
LMTCB

## 2019-02-07 NOTE — Telephone Encounter (Signed)
-----   Message from Virginia Crews, MD sent at 02/07/2019  2:16 PM EST ----- Normal labs, except A1c remains in prediabetic range.  Cholesterol is stable and slightly elevated. No meds needed, but do recommend diet low in saturated fat and regular exercise - 30 min at least 5 times per week.

## 2019-02-07 NOTE — Telephone Encounter (Signed)
Patient advised.

## 2019-08-07 ENCOUNTER — Ambulatory Visit: Payer: BC Managed Care – PPO | Admitting: Family Medicine

## 2020-01-05 ENCOUNTER — Ambulatory Visit: Payer: BC Managed Care – PPO | Attending: Internal Medicine

## 2020-01-05 DIAGNOSIS — Z20822 Contact with and (suspected) exposure to covid-19: Secondary | ICD-10-CM

## 2020-01-07 LAB — NOVEL CORONAVIRUS, NAA: SARS-CoV-2, NAA: NOT DETECTED

## 2020-01-09 ENCOUNTER — Encounter: Payer: Self-pay | Admitting: Family Medicine

## 2020-01-09 ENCOUNTER — Telehealth (INDEPENDENT_AMBULATORY_CARE_PROVIDER_SITE_OTHER): Payer: BC Managed Care – PPO | Admitting: Family Medicine

## 2020-01-09 VITALS — Temp 99.6°F | Ht 69.0 in | Wt 245.0 lb

## 2020-01-09 DIAGNOSIS — J069 Acute upper respiratory infection, unspecified: Secondary | ICD-10-CM | POA: Diagnosis not present

## 2020-01-09 MED ORDER — BENZONATATE 100 MG PO CAPS: 100.0000 mg | ORAL_CAPSULE | Freq: Two times a day (BID) | ORAL | 0 refills | Status: DC | PRN

## 2020-01-09 MED ORDER — PREDNISONE 50 MG PO TABS: 50.0000 mg | ORAL_TABLET | Freq: Every day | ORAL | 0 refills | Status: AC

## 2020-01-09 NOTE — Progress Notes (Signed)
Patient: Bianca Lee Female    DOB: June 12, 1976   44 y.o.   MRN: [id number] Visit Date: 01/09/2020  Today's Provider: Lavon Paganini, MD   Chief Complaint  Patient presents with  . URI   Subjective:    I [place] S. Dimas, CMA, am acting as scribe for Lavon Paganini, MD.  Virtual Visit via Video Note  I connected with Bianca Lee on 01/09/20 at  3:40 PM EST by a video enabled telemedicine application and verified that I am speaking with the correct person using two identifiers.  Location: Patient location: home Provider location: [facility] Persons involved in the visit: patient, provider   I discussed the limitations of evaluation and management by telemedicine and the availability of in person appointments. The patient expressed understanding and agreed to proceed.  HPI Upper Respiratory Infection: Patient complains of symptoms of a URI, possible sinusitis. Symptoms include congestion, cough and sore throat. Onset of symptoms was 8 days ago, gradually worsening since that time. She also c/o achiness, congestion and cough described as productive and productive of yellow sputum for the past 8 days .  She is drinking plenty of fluids. Evaluation to date: none. Treatment to date: cough suppressants and Delsym, Tessalon Pearls, Tylenol, and Emergen-Z. Patient tested negative for COVID-19 on 01/05/20.  32 y/o daughter with similar symptoms (COVID test pending).  Started with significant fatigue and progressed to sore throat and cough.  Temperature around 99.6.  Hears crackling with exhaling.  +headaches   Allergies  Allergen Reactions  . Morphine And Related Other (See Comments)    Pt states that this medication gave her a headache.      Current Outpatient Medications:  .  diphenhydrAMINE (BENADRYL) 25 MG tablet, Take 25 mg by mouth every evening., Disp: , Rfl:  .  sertraline (ZOLOFT) 50 MG tablet, Take 50 mg by mouth daily., Disp: , Rfl:  .   benzonatate (TESSALON) 100 MG capsule, Take 1 capsule (100 mg total) by mouth 2 (two) times daily as needed for cough., Disp: 20 capsule, Rfl: 0 .  colestipol (COLESTID) 1 g tablet, Take 1 tablet (1 g total) by mouth 2 (two) times daily. (Patient not taking: Reported on 01/09/2020), Disp: 60 tablet, Rfl: 3 .  predniSONE (DELTASONE) 50 MG tablet, Take 1 tablet (50 mg total) by mouth daily with breakfast for 7 days., Disp: 7 tablet, Rfl: 0  Review of Systems  Constitutional: Positive for fatigue and fever.  HENT: Positive for congestion.   Respiratory: Positive for cough.   Musculoskeletal: Positive for myalgias.  Neurological: Positive for headaches.    Social History   Tobacco Use  . Smoking status: Former Smoker    Packs/day: 0.25    Years: 4.00    Pack years: 1.00    Types: Cigarettes    Quit date: 1998    Years since quitting: 23.0  . Smokeless tobacco: Never Used  Substance Use Topics  . Alcohol use: No    Alcohol/week: 0.0 standard drinks      Objective:   Temp 99.6 F (37.6 C) (Oral)   Ht 5\' 9"  (1.753 m)   Wt 245 lb (111.1 kg)   BMI 36.18 kg/m  Vitals:   01/09/20 1329  Temp: 99.6 F (37.6 C)  TempSrc: Oral  Weight: 245 lb (111.1 kg)  Height: 5\' 9"  (1.753 m)  Body mass index is 36.18 kg/m.   Physical Exam Constitutional:      General: She  is not in acute distress.    Appearance: Normal appearance. She is not diaphoretic.  Eyes:     General: No scleral icterus.    Conjunctiva/sclera: Conjunctivae normal.  Pulmonary:     Effort: Pulmonary effort is normal. No respiratory distress.     Comments: Coughing intermittently Neurological:     Mental Status: She is alert and oriented to person, place, and time. Mental status is at baseline.  Psychiatric:        Mood and Affect: Mood normal.        Behavior: Behavior normal.      No results found for any visits on 01/09/20.     Assessment & Plan    I discussed the assessment and treatment plan with the  patient. The patient was provided an opportunity to ask questions and all were answered. The patient agreed with the plan and demonstrated an understanding of the instructions.   The patient was advised to call back or seek an in-person evaluation if the symptoms worsen or if the condition fails to improve as anticipated.  1. Viral URI with cough - symptoms and exam c/w viral URI  - doubt strep pharyngitis, CAP, bacterial sinusitis, or other bacterial infection - concerning for possible COVID19 infection, but COVID test was negative - discussed need to self-isolate, along with all household members, for 10 days from start of illness and until fever free for at least 48 hours. - given bronchitis symptoms, will give prednisone burst - tessalon prn for cough - discussed symptomatic management, natural course, and return precautions      Meds ordered this encounter  Medications  . benzonatate (TESSALON) 100 MG capsule    Sig: Take 1 capsule (100 mg total) by mouth 2 (two) times daily as needed for cough.    Dispense:  20 capsule    Refill:  0  . predniSONE (DELTASONE) 50 MG tablet    Sig: Take 1 tablet (50 mg total) by mouth daily with breakfast for 7 days.    Dispense:  7 tablet    Refill:  0     Return if symptoms worsen or fail to improve.   The entirety of the information documented in the History of Present Illness, Review of Systems and Physical Exam were personally obtained by me. Portions of this information were initially documented by Lynford Humphrey, CMA and reviewed by me for thoroughness and accuracy.    Avagrace Botelho, Dionne Bucy, MD MPH [facility]

## 2020-02-14 ENCOUNTER — Ambulatory Visit: Payer: BC Managed Care – PPO | Attending: Internal Medicine

## 2020-02-14 DIAGNOSIS — Z23 Encounter for immunization: Secondary | ICD-10-CM | POA: Insufficient documentation

## 2020-02-14 NOTE — Progress Notes (Signed)
   Covid-19 Vaccination Clinic  Name:  CANTINA VERBECK    MRN: [id number] DOB: June 16, 1976  02/14/2020  Ms. Pond was observed post Covid-19 immunization for 15 minutes without incidence. She was provided with Vaccine Information Sheet and instruction to access the V-Safe system.   Ms. Gilfillan was instructed to call 911 with any severe reactions post vaccine: Marland Kitchen Difficulty breathing  . Swelling of your face and throat  . A fast heartbeat  . A bad rash all over your body  . Dizziness and weakness    Immunizations Administered    Name Date Dose VIS Date Route   Moderna COVID-19 Vaccine 02/14/2020 11:31 AM 0.5 mL 11/18/2019 Intramuscular   Manufacturer: Moderna   Lot: [id number]   [id number]

## 2020-03-13 ENCOUNTER — Ambulatory Visit: Payer: BC Managed Care – PPO | Attending: Internal Medicine

## 2020-03-13 DIAGNOSIS — Z23 Encounter for immunization: Secondary | ICD-10-CM

## 2020-03-13 NOTE — Progress Notes (Signed)
   Covid-19 Vaccination Clinic  Name:  Bianca Lee    MRN: [id number] DOB: [birth year]  03/13/2020  [name] was observed post Covid-19 immunization for 15 minutes without incident. She was provided with Vaccine Information Sheet and instruction to access the V-Safe system.   [name] was instructed to call 911 with any severe reactions post vaccine: Marland Kitchen Difficulty breathing  . Swelling of face and throat  . A fast heartbeat  . A bad rash all over body  . Dizziness and weakness   Immunizations Administered    Name Date Dose VIS Date Route   Moderna COVID-19 Vaccine 03/13/2020 11:50 AM 0.5 mL 11/18/2019 Intramuscular   Manufacturer: Levan Hurst   LotFY:[id number]   [place][id number]

## 2021-06-30 ENCOUNTER — Other Ambulatory Visit: Payer: Self-pay | Admitting: Family Medicine

## 2021-06-30 ENCOUNTER — Ambulatory Visit: Payer: Self-pay | Admitting: *Deleted

## 2021-06-30 NOTE — Telephone Encounter (Signed)
C/o covid symptoms worsening. Patient tested positive with at home covid test last night. C/o sore throat Sunday or Monday. Symptoms worsened yesterday with chills , fever 100.5, nasal congestion, dry cough with periods of productive cough, clear to yellow sputum. C/o body aches. Denies chest pain , difficulty breathing. Reports loss of taste and smell today. Patient requesting medication that may help symptoms. Reviewed isolation precautions.  Patient reports she has been taking tylenol every 6 hours . Care advise given. Patient verbalized understanding of care advise and to call back or go to [facility] or ED if symptoms worsen.

## 2021-06-30 NOTE — Telephone Encounter (Signed)
Noted  

## 2021-06-30 NOTE — Telephone Encounter (Signed)
Reason for Disposition  [1] COVID-19 diagnosed by positive lab test (e.g., PCR, rapid self-test kit) AND [2] mild symptoms (e.g., cough, fever, others) AND [4] no complications or SOB  Answer Assessment - Initial Assessment Questions 1. COVID-19 DIAGNOSIS: "Who made your COVID-19 diagnosis?" "Was it confirmed by a positive lab test or self-test?" If not diagnosed by a doctor (or NP/PA), ask "Are there lots of cases (community spread) where you live?" Note: See public health department website, if unsure.     At  home covid positive test  2. COVID-19 EXPOSURE: "Was there any known exposure to COVID before the symptoms began?" CDC Definition of close contact: within 6 feet (2 meters) for a total of 15 minutes or more over a 24-hour period.      Not sure was on a plane recently 3. ONSET: "When did the COVID-19 symptoms start?"      Sore throat started on Sunday or Monday worsening symptoms yesterday  4. WORST SYMPTOM: "What is your worst symptom?" (e.g., cough, fever, shortness of breath, muscle aches)    Fever, chills , congestion, dry cough, body aches 5. COUGH: "Do you have a cough?" If Yes, ask: "How bad is the cough?"       Yes , mostly dry cough, some productive clear to yellow sputum 6. FEVER: "Do you have a fever?" If Yes, ask: "What is your temperature, how was it measured, and when did it start?"     Yes . 100.5 7. RESPIRATORY STATUS: "Describe your breathing?" (e.g., shortness of breath, wheezing, unable to speak)      No  8. BETTER-SAME-WORSE: "Are you getting better, staying the same or getting worse compared to yesterday?"  If getting worse, ask, "In what way?"     Worse  9. HIGH RISK DISEASE: "Do you have any chronic medical problems?" (e.g., asthma, heart or lung disease, weak immune system, obesity, etc.)     overweight 10. VACCINE: "Have you had the COVID-19 vaccine?" If Yes, ask: "Which one, how many shots, when did you get it?"       Yes moderna  11. BOOSTER: "Have you  received your COVID-19 booster?" If Yes, ask: "Which one and when did you get it?"       Yes  Moderna 12. PREGNANCY: "Is there any chance you are pregnant?" "When was your last menstrual period?"       na 13. OTHER SYMPTOMS: "Do you have any other symptoms?"  (e.g., chills, fatigue, headache, loss of smell or taste, muscle pain, sore throat)       Chills , loss of taste and smell, fever, congestion, dry cough  14. O2 SATURATION MONITOR:  "Do you use an oxygen saturation monitor (pulse oximeter) at home?" If Yes, ask "What is your reading (oxygen level) today?" "What is your usual oxygen saturation reading?" (e.g., 95%)       na  Protocols used: Coronavirus (COVID-19) Diagnosed or Suspected-A-AH

## 2021-07-08 MED ORDER — BENZONATATE 100 MG PO CAPS: 100.0000 mg | ORAL_CAPSULE | Freq: Two times a day (BID) | ORAL | 0 refills | Status: DC | PRN

## 2021-07-26 ENCOUNTER — Telehealth: Payer: Self-pay

## 2021-07-26 NOTE — Telephone Encounter (Signed)
Copied from [place] [id number]. Topic: General - Other >> Jul 26, 2021 12:11 PM Antonieta Iba C wrote: Reason for CRM: pt called in for assistance. Pt says that she had covid and is feeling a lot better. Pt says that she is still experiencing some of the symptoms. Cough, congestion. Pt would like to know if provider could send in something that could help to the pharmacy ?   [id number] Phone:  [phone]:  [phone]

## 2021-07-27 ENCOUNTER — Other Ambulatory Visit: Payer: Self-pay | Admitting: Family Medicine

## 2021-07-27 MED ORDER — ALBUTEROL SULFATE HFA 108 (90 BASE) MCG/ACT IN AERS: 2.0000 | INHALATION_SPRAY | Freq: Four times a day (QID) | RESPIRATORY_TRACT | 0 refills | Status: DC | PRN

## 2021-07-27 MED ORDER — GUAIFENESIN-DM 100-10 MG/5ML PO SYRP: 5.0000 mL | ORAL_SOLUTION | ORAL | 0 refills | Status: DC | PRN

## 2021-07-27 MED ORDER — BENZONATATE 100 MG PO CAPS: 200.0000 mg | ORAL_CAPSULE | Freq: Three times a day (TID) | ORAL | 0 refills | Status: DC | PRN

## 2021-07-27 NOTE — Telephone Encounter (Signed)
See patient request below, reviewing chart last triage note states that patient tested positive on 06/29/21 for covid, since its been going on almost a month, would you suggest office visit for reevaluation? Please review. KW

## 2021-07-27 NOTE — Telephone Encounter (Signed)
Advised 

## 2021-08-18 ENCOUNTER — Other Ambulatory Visit: Payer: Self-pay | Admitting: Family Medicine

## 2021-10-18 ENCOUNTER — Encounter: Payer: Self-pay | Admitting: Gastroenterology

## 2023-03-26 ENCOUNTER — Telehealth: Payer: Self-pay | Admitting: Oncology

## 2023-03-26 NOTE — Telephone Encounter (Signed)
scheduled per 4/5 referral, pt has been called and confirmed date and time. Pt is aware of location and to arrive early for check in   

## 2023-04-13 ENCOUNTER — Inpatient Hospital Stay: Payer: BC Managed Care – PPO

## 2023-04-13 ENCOUNTER — Inpatient Hospital Stay: Payer: BC Managed Care – PPO | Attending: Oncology | Admitting: Oncology

## 2023-04-13 VITALS — BP 149/95 | HR 86 | Temp 97.8°F | Resp 18 | Ht 69.0 in | Wt 263.1 lb

## 2023-04-13 DIAGNOSIS — Z79899 Other long term (current) drug therapy: Secondary | ICD-10-CM | POA: Diagnosis not present

## 2023-04-13 DIAGNOSIS — Z87891 Personal history of nicotine dependence: Secondary | ICD-10-CM | POA: Insufficient documentation

## 2023-04-13 DIAGNOSIS — E669 Obesity, unspecified: Secondary | ICD-10-CM | POA: Diagnosis not present

## 2023-04-13 DIAGNOSIS — Z3009 Encounter for other general counseling and advice on contraception: Secondary | ICD-10-CM

## 2023-04-13 DIAGNOSIS — Z6837 Body mass index (BMI) 37.0-37.9, adult: Secondary | ICD-10-CM | POA: Diagnosis not present

## 2023-04-13 DIAGNOSIS — Z85828 Personal history of other malignant neoplasm of skin: Secondary | ICD-10-CM | POA: Insufficient documentation

## 2023-04-13 DIAGNOSIS — E66812 Obesity, class 2: Secondary | ICD-10-CM

## 2023-04-13 DIAGNOSIS — D6851 Activated protein C resistance: Secondary | ICD-10-CM

## 2023-04-13 DIAGNOSIS — Z800 Family history of malignant neoplasm of digestive organs: Secondary | ICD-10-CM | POA: Diagnosis not present

## 2023-04-13 NOTE — Progress Notes (Signed)
[facility] Cancer Initial Visit:  Patient Care Team: Erasmo Downer, MD as PCP - General (Family Medicine)  CHIEF COMPLAINTS/PURPOSE OF CONSULTATION:  HISTORY OF PRESENTING ILLNESS: Bianca Lee 47 y.o. female is here because of factor V Leiden gene mutation. Medical history notable for factor V Leiden gene mutation in father, childhood asthma, basal cell carcinoma of the skin  April 13 2023:  [facility] Hematology Consult  Patient was tested because of paternal history of FV Leiden gene mutation.  She is G1 P1 and delivered by C-section without VTE but was placed on prophylactic lovenox post delivery Was on OCP's from 2 to 71 years of age.  Patient is interested in going back on OCP's because she is having bleeding issues with the IUD.  She does not think she is interested in having more children  Social:  School [employer] school.  Divorced.  Tobacco none.  EtOH none  [facility] Father alive 39 homogenous for FV Leiden Gene mutation with course complicated by numerous VTE (PE and DVT) Mother alive 12 history of DVT in high school Sister alive 38 heterozygous for FV Leiden Gene without history of DVT  Review of Systems - Oncology  MEDICAL HISTORY: Past Medical History:  Diagnosis Date   Arthritis    Asthma    Childhood   Cancer (HCC)    basal cell carcinoma   Clotting disorder (HCC)    Factor 5 Leiden mutation, heterozygous (HCC)    Foot fracture     SURGICAL HISTORY: Past Surgical History:  Procedure Laterality Date   CESAREAN SECTION N/A 08/01/2014   Procedure: CESAREAN SECTION;  Surgeon: Leslie Andrea, MD;  Location: [facility];  Service: Obstetrics;  Laterality: N/A;  arrest of descent   CHOLECYSTECTOMY     TONSILLECTOMY      SOCIAL HISTORY: Social History   Socioeconomic History   Marital status: Divorced    Spouse name: Not on file   Number of children: 1   Years of education: Not on file   Highest education level: Not on file   Occupational History   Occupation: high school counselor  Tobacco Use   Smoking status: Former    Packs/day: 0.25    Years: 4.00    Additional pack years: 0.00    Total pack years: 1.00    Types: Cigarettes    Quit date: 1998    Years since quitting: 26.3   Smokeless tobacco: Never  Vaping Use   Vaping Use: Never used  Substance and Sexual Activity   Alcohol use: No    Alcohol/week: 0.0 standard drinks of alcohol   Drug use: No   Sexual activity: Yes    Partners: Male    Birth control/protection: I.U.D.  Other Topics Concern   Not on file  Social History Narrative   Not on file   Social Determinants of Health   Financial Resource Strain: Not on file  Food Insecurity: Not on file  Transportation Needs: Not on file  Physical Activity: Not on file  Stress: Not on file  Social Connections: Not on file  Intimate Partner Violence: Not on file    FAMILY HISTORY Family History  Problem Relation Age of Onset   Rectal cancer Father 52   Clotting disorder Father        Factor 5 Leiden   Diabetes Father    Rectal cancer Paternal Uncle    Prostate cancer Paternal Uncle    Clotting disorder Sister  Factor 5 Leiden heterozygous   Crohn's disease Maternal Aunt    Irritable bowel syndrome Maternal Aunt    Arthritis Maternal Aunt    Diabetes Paternal Aunt    Cancer Paternal Aunt        unknown type   Stroke Paternal Aunt    Heart disease Maternal Grandfather    Skin cancer Mother    Arthritis Mother    Hypertension Mother    Cataracts Mother    Lung cancer Maternal Grandmother    COPD Maternal Grandmother    Stroke Maternal Grandmother    Cataracts Maternal Grandmother    Heart disease Maternal Uncle    Rheum arthritis Paternal Grandmother     ALLERGIES:  is allergic to morphine and related.  MEDICATIONS:  Current Outpatient Medications  Medication Sig Dispense Refill   albuterol (VENTOLIN HFA) 108 (90 Base) MCG/ACT inhaler TAKE 2 PUFFS BY MOUTH EVERY 6  HOURS AS NEEDED FOR WHEEZE OR SHORTNESS OF BREATH 8.5 each 0   benzonatate (TESSALON) 100 MG capsule Take 2 capsules (200 mg total) by mouth 3 (three) times daily as needed for cough. 30 capsule 0   colestipol (COLESTID) 1 g tablet Take 1 tablet (1 g total) by mouth 2 (two) times daily. (Patient not taking: Reported on 01/09/2020) 60 tablet 3   diphenhydrAMINE (BENADRYL) 25 MG tablet Take 25 mg by mouth every evening.     guaiFENesin-dextromethorphan (ROBITUSSIN DM) 100-10 MG/5ML syrup Take 5 mLs by mouth every 4 (four) hours as needed for cough. 118 mL 0   sertraline (ZOLOFT) 50 MG tablet Take 50 mg by mouth daily.     No current facility-administered medications for this visit.    PHYSICAL EXAMINATION:  ECOG PERFORMANCE STATUS: 0 - Asymptomatic   There were no vitals filed for this visit.  There were no vitals filed for this visit.   Physical Exam Vitals and nursing note reviewed.  Constitutional:      Appearance: Normal appearance. She is not toxic-appearing or diaphoretic.     Comments: Here alone.    HENT:     Head: Normocephalic and atraumatic.     Right Ear: External ear normal.     Left Ear: External ear normal.     Nose: Nose normal. No congestion or rhinorrhea.  Eyes:     General: No scleral icterus.    Extraocular Movements: Extraocular movements intact.     Conjunctiva/sclera: Conjunctivae normal.     Pupils: Pupils are equal, round, and reactive to light.  Cardiovascular:     Rate and Rhythm: Normal rate and regular rhythm.     Heart sounds: No murmur heard.    No friction rub. No gallop.  Pulmonary:     Effort: Pulmonary effort is normal. No respiratory distress.     Breath sounds: Normal breath sounds. No stridor. No wheezing or rhonchi.  Abdominal:     General: Bowel sounds are normal. There is no distension.     Palpations: Abdomen is soft.     Tenderness: There is no abdominal tenderness. There is no guarding.  Musculoskeletal:        General: No  swelling, tenderness or deformity.     Cervical back: Normal range of motion and neck supple. No rigidity or tenderness.     Right lower leg: No edema.     Left lower leg: No edema.  Lymphadenopathy:     Head:     Right side of head: No submental, submandibular, tonsillar, preauricular, posterior auricular  or occipital adenopathy.     Left side of head: No submental, submandibular, tonsillar, preauricular, posterior auricular or occipital adenopathy.     Cervical: No cervical adenopathy.     Right cervical: No superficial, deep or posterior cervical adenopathy.    Left cervical: No superficial, deep or posterior cervical adenopathy.     Upper Body:     Right upper body: No supraclavicular, axillary, pectoral or epitrochlear adenopathy.     Left upper body: No supraclavicular, axillary, pectoral or epitrochlear adenopathy.  Skin:    General: Skin is warm.     Coloration: Skin is not jaundiced.  Neurological:     General: No focal deficit present.     Mental Status: She is alert and oriented to person, place, and time.     Cranial Nerves: No cranial nerve deficit.  Psychiatric:        Mood and Affect: Mood normal.        Behavior: Behavior normal.        Thought Content: Thought content normal.        Judgment: Judgment normal.      LABORATORY DATA: I have personally reviewed the data as listed:  No visits with results within 1 Month(s) from this visit.  Latest known visit with results is:  Lab on 01/05/2020  Component Date Value Ref Range Status   SARS-CoV-2, NAA 01/05/2020 Not Detected  Not Detected Final   Comment: This nucleic acid amplification test was developed and its performance characteristics determined by [employer]. Nucleic acid amplification tests include PCR and TMA. This test has not been FDA cleared or approved. This test has been authorized by FDA under an Emergency Use Authorization (EUA). This test is only authorized for the duration of time the  declaration that circumstances exist justifying the authorization of the emergency use of in vitro diagnostic tests for detection of SARS-CoV-2 virus and/or diagnosis of COVID-19 infection under section 564(b)(1) of the Act, 21 U.S.C. 161WRU-0(A) (1), unless the authorization is terminated or revoked sooner. When diagnostic testing is negative, the possibility of a false negative result should be considered in the context of a patient's recent exposures and the presence of clinical signs and symptoms consistent with COVID-19. An individual without symptoms of COVID-19 and who is not shedding SARS-CoV-2 virus would                           expect to have a negative (not detected) result in this assay.     RADIOGRAPHIC STUDIES: I have personally reviewed the radiological images as listed and agree with the findings in the report  No results found.  ASSESSMENT/PLAN 47 y.o. female is here because of factor V Leiden gene mutation.  Medical history notable for factor V Leiden gene mutation in father, childhood asthma, basal cell carcinoma of the skin   Factor V Leiden:  Factor V, is a clotting factor that amplifies thrombin production.  Thrombin converts fibrinogen to fibrin (hence clot formation).   Factor V Leiden (FVL) results from a point mutation in the factor V gene leading to replacement of arginine with glutamine at amino acid 506 hence abolishing the FV Arg506 cleavage site for activated protein C.  The FV Leiden mutation increases coagulation by increasing the procoagulant role of FVa and it interferes with the ability of FV to act as a cofactor in degradation of FVa and FVIIIa.      FV Leiden is an autosomal  dominant condition.  99% of individuals with FVL are heterozygous.  1% are homozygotes or pseudohomozygotes.  Heterozygosity for FVL is the most common inherited thrombophilia in Caucasians with venous thromboembolism (VTE).    Magnitude thrombotic risk: Lifetime VTE risk with  heterozygosity and homozygosity 4.2% and 11.5% respectively.   Relative risk of VTE with heterozygous FV Leiden gene mutation is 7 Patients with FVL are more likely to present with DVT than PE, 19 % versus 9 %, respectively  FV Leiden gene abnormality is synergistic with estrogen containing oral contraceptives with regard to VTE risk.  Relative risk is 35.    VTE risk factors  April 13 2023- Patient has two synergistic risk factors for development of VTE these are 1) Factor V Leiden gene mutation 2) Obesity  Counseling April 13 2023- I recommend against use of estrogen containing methods for contraception as this would add a third VTE risk factor.  Since patient is not interested in having more children tubal ligation might be reasonable.  Another method of contraception without increased VTE risk would be a copper IUD   Cancer Staging  No matching staging information was found for the patient.   No problem-specific Assessment & Plan notes found for this encounter.    No orders of the defined types were placed in this encounter.   45  minutes was spent in patient care.  This included time spent preparing to see the patient (e.g., review of tests), obtaining and/or reviewing separately obtained history, counseling and educating the patient/family/caregiver, ordering medications, tests, or procedures; documenting clinical information in the electronic or other health record, independently interpreting results and communicating results to the patient/family/caregiver as well as coordination of care.       All questions were answered. The patient knows to call the clinic with any problems, questions or concerns.  This note was electronically signed.    Loni Muse, MD  04/13/2023 12:03 PM

## 2023-04-26 DIAGNOSIS — Z3009 Encounter for other general counseling and advice on contraception: Secondary | ICD-10-CM | POA: Insufficient documentation

## 2023-06-13 ENCOUNTER — Ambulatory Visit
Admission: EM | Admit: 2023-06-13 | Discharge: 2023-06-13 | Disposition: A | Discharge status: Home / Self Care | Payer: BC Managed Care – PPO

## 2023-06-13 DIAGNOSIS — R509 Fever, unspecified: Secondary | ICD-10-CM | POA: Diagnosis not present

## 2023-06-13 NOTE — ED Triage Notes (Signed)
Patient presents to UC for fever, body aches, and weakness x 5 days. Has had 2 negative covid tests. Treating symptoms with tylenol q4 hrs.

## 2023-06-13 NOTE — ED Provider Notes (Signed)
[facility]    CSN: [id number] Arrival date & time: 06/13/23  1849      History   Chief Complaint Chief Complaint  Patient presents with   Fever   Generalized Body Aches   Weakness    HPI Bianca Lee is a 47 y.o. female.    Fever Weakness Associated symptoms: fever     Resents to urgent care with complaint of fever, body aches, weakness x 5 days.  She endorses 2 negative COVID tests.  Treating her symptoms with Tylenol.  Patient states that her boyfriend was sick with similar symptoms starting about 1 week before her.  She was with him on the weekend that he became sick, and went again to visit him to care for him about 1 week ago.  She states her illness started about 5 days ago.  In addition to the patient's reported symptoms, her boyfriend also had upper respiratory symptoms and was treated for pneumonia with antibiotics.  The patient denies cough and denies upper respiratory symptoms.  She does say that her right ear had some discomfort a few days ago but now resolved.  She reports a little bit of nausea that is improved with eating.  She reports multiple episodes of loose stools today.  Tmax 102.X F which does not respond well to Tylenol.  Past Medical History:  Diagnosis Date   Arthritis    Asthma    Childhood   Cancer (HCC)    basal cell carcinoma   Clotting disorder (HCC)    Factor 5 Leiden mutation, heterozygous (HCC)    Foot fracture     Patient Active Problem List   Diagnosis Date Noted   Advised about oral contraception 04/26/2023   Obesity 02/05/2019   History of basal cell carcinoma (BCC) 10/04/2018   Adjustment reaction with anxiety and depression 10/04/2018   Osteoarthritis 10/04/2018   Heterozygous factor V Leiden mutation (HCC) 10/04/2018   Prediabetes 10/02/2018   Diarrhea 06/28/2016   Family history of rectal cancer 09/02/2012    Past Surgical History:  Procedure Laterality Date   CESAREAN SECTION N/A 08/01/2014    Procedure: CESAREAN SECTION;  Surgeon: Leslie Andrea, MD;  Location: [facility];  Service: Obstetrics;  Laterality: N/A;  arrest of descent   CHOLECYSTECTOMY     TONSILLECTOMY      OB History     Gravida  1   Para  1   Term  1   Preterm      AB      Living  1      SAB      IAB      Ectopic      Multiple      Live Births  1            Home Medications    Prior to Admission medications   Medication Sig Start Date End Date Taking? Authorizing Provider  albuterol (VENTOLIN HFA) 108 (90 Base) MCG/ACT inhaler TAKE 2 PUFFS BY MOUTH EVERY 6 HOURS AS NEEDED FOR WHEEZE OR SHORTNESS OF BREATH Patient not taking: Reported on 04/13/2023 08/19/21   Jacky Kindle, FNP  benzonatate (TESSALON) 100 MG capsule Take 2 capsules (200 mg total) by mouth 3 (three) times daily as needed for cough. Patient not taking: Reported on 04/13/2023 07/27/21   Jacky Kindle, FNP  colestipol (COLESTID) 1 g tablet Take 1 tablet (1 g total) by mouth 2 (two) times daily. Patient not taking: Reported on 01/09/2020  10/02/18   Erasmo Downer, MD  diphenhydrAMINE (BENADRYL) 25 MG tablet Take 25 mg by mouth every evening.    [provider]  guaiFENesin-dextromethorphan (ROBITUSSIN DM) 100-10 MG/5ML syrup Take 5 mLs by mouth every 4 (four) hours as needed for cough. Patient not taking: Reported on 04/13/2023 07/27/21   Merita Norton T, FNP  sertraline (ZOLOFT) 50 MG tablet Take 50 mg by mouth daily.    [provider]    Family History Family History  Problem Relation Age of Onset   Rectal cancer Father 71   Clotting disorder Father        Factor 5 Leiden   Diabetes Father    Rectal cancer Paternal Uncle    Prostate cancer Paternal Uncle    Clotting disorder Sister        Factor 5 Leiden heterozygous   Crohn's disease Maternal Aunt    Irritable bowel syndrome Maternal Aunt    Arthritis Maternal Aunt    Diabetes Paternal Aunt    Cancer Paternal Aunt        unknown type    Stroke Paternal Aunt    Heart disease Maternal Grandfather    Skin cancer Mother    Arthritis Mother    Hypertension Mother    Cataracts Mother    Lung cancer Maternal Grandmother    COPD Maternal Grandmother    Stroke Maternal Grandmother    Cataracts Maternal Grandmother    Heart disease Maternal Uncle    Rheum arthritis Paternal Grandmother     Social History Social History   Tobacco Use   Smoking status: Former    Packs/day: 0.25    Years: 4.00    Additional pack years: 0.00    Total pack years: 1.00    Types: Cigarettes    Quit date: 1998    Years since quitting: 26.5   Smokeless tobacco: Never  Vaping Use   Vaping Use: Never used  Substance Use Topics   Alcohol use: No    Alcohol/week: 0.0 standard drinks of alcohol   Drug use: No     Allergies   Morphine and codeine   Review of Systems Review of Systems  Constitutional:  Positive for fever.  Neurological:  Positive for weakness.     Physical Exam Triage Vital Signs ED Triage Vitals  Enc Vitals Group     BP 06/13/23 1918 129/82     Pulse Rate 06/13/23 1918 (!) 106     Resp 06/13/23 1918 16     Temp 06/13/23 1918 99.1 F (37.3 C)     Temp Source 06/13/23 1918 Oral     SpO2 06/13/23 1918 97 %     Weight --      Height --      Head Circumference --      Peak Flow --      Pain Score 06/13/23 1919 0     Pain Loc --      Pain Edu? --      Excl. in GC? --    No data found.  Updated Vital Signs BP 129/82 (BP Location: Left Arm)   Pulse (!) 106   Temp 99.1 F (37.3 C) (Oral)   Resp 16   SpO2 97%   Breastfeeding No   Visual Acuity Right Eye Distance:   Left Eye Distance:   Bilateral Distance:    Right Eye Near:   Left Eye Near:    Bilateral Near:     Physical Exam Vitals  reviewed.  Constitutional:      Appearance: Normal appearance.  HENT:     Right Ear: Tympanic membrane normal.     Left Ear: A middle ear effusion is present.     Nose: No congestion.     Mouth/Throat:      Mouth: Mucous membranes are moist.     Pharynx: No oropharyngeal exudate or posterior oropharyngeal erythema.  Cardiovascular:     Rate and Rhythm: Normal rate and regular rhythm.     Pulses: Normal pulses.     Heart sounds: Normal heart sounds.  Pulmonary:     Effort: Pulmonary effort is normal.     Breath sounds: Normal breath sounds.  Abdominal:     Palpations: Abdomen is soft.     Tenderness: There is generalized abdominal tenderness.  Skin:    General: Skin is warm and dry.  Neurological:     General: No focal deficit present.     Mental Status: She is alert and oriented to person, place, and time.  Psychiatric:        Mood and Affect: Mood normal.        Behavior: Behavior normal.      UC Treatments / Results  Labs (all labs ordered are listed, but only abnormal results are displayed) Labs Reviewed - No data to display  EKG   Radiology No results found.  Procedures Procedures (including critical care time)  Medications Ordered in UC Medications - No data to display  Initial Impression / Assessment and Plan / UC Course  I have reviewed the triage vital signs and the nursing notes.  Pertinent labs & imaging results that were available during my care of the patient were reviewed by me and considered in my medical decision making (see chart for details).   ERIONA KINCHEN is a 47 y.o. female presenting with fever. Patient is afebrile without recent antipyretics, satting well on room air. Overall is well appearing though non-toxic, well hydrated, without respiratory distress. Pulmonary exam is unremarkable.  Lungs CTAB without wheezing, rhonchi, rales. RRR without murmurs, rubs, gallops.  No pharyngeal erythema or peritonsillar exudate.  Right TM is WNL.  Left TM shows signs of middle ear effusion.  There is diffuse abdominal tenderness with palpation  Reviewed relevant chart history.   Symptoms are consistent with an acute viral process.  She had negative COVID at  home.  No red flag symptoms.  Discussed risk for dehydration given her nausea, possibly avoidance of can fluids and presence of loose stools.  She declines offer for antiemetic prescription.  Recommending supportive care and use of OTC medication for symptom control including antipyretics.  Counseled patient on potential for adverse effects with medications prescribed/recommended today, ER and return-to-clinic precautions discussed, patient verbalized understanding and agreement with care plan.  Final Clinical Impressions(s) / UC Diagnoses   Final diagnoses:  None   Discharge Instructions   None    ED Prescriptions   None    PDMP not reviewed this encounter.   Charma Igo, [place] 06/13/23 1948

## 2023-06-13 NOTE — Discharge Instructions (Signed)
Follow up here or with your primary care provider if your symptoms are worsening or not improving.     

## 2023-06-14 ENCOUNTER — Ambulatory Visit: Payer: Self-pay

## 2023-06-22 ENCOUNTER — Ambulatory Visit: Payer: Self-pay | Admitting: *Deleted

## 2023-06-22 NOTE — Telephone Encounter (Signed)
    Summary: Feet & Hands swollen Advice   Pt is calling to report that she woke up yesterday with L & R hand swollen swollen, & Fatigue, with L & R feet. Last week patient was sick when to [facility] told my be viral. Please advise           Chief Complaint: swelling bilateral hands and legs Symptoms: upon awakening noted bilateral hands and fingers swollen, and difficulty opening hands . Better now after taking aleeve. Leg swelling decrease since this am joint soreness, fatigue. Dx last week with viral infection. Has been drinking extra water Frequency: yesterday  Pertinent Negatives: Patient denies chest pain . No difficulty breathing. No fever  Disposition: [] ED /[] Urgent Care (no appt availability in office) / [x] Appointment(In office/virtual)/ []  [facility]/ [] Home Care/ [] Refused Recommended Disposition /[] [facility]/ []  Follow-up with PCP Additional Notes:   No available appt with PCP until 7/25. Appt scheduled for 06/25/23. Recommended if swelling returns or worsening sx go to ED.     Reason for Disposition  MODERATE hand swelling (e.g., visible swelling of hand and fingers; pitting edema)  Answer Assessment - Initial Assessment Questions 1. ONSET: "When did the swelling start?" (e.g., minutes, hours, days)     Yesterday  2. LOCATION: "What part of the hand is swollen?"  "Are both hands swollen or just one hand?"     Bilateral hands and fingers  3. SEVERITY: "How bad is the swelling?" (e.g., localized; mild, moderate, severe)   - BALL OR LUMP: small ball or lump   - LOCALIZED: puffy or swollen area or patch of skin   - JOINT SWELLING: swelling of a joint   - MILD: puffiness or mild swelling of fingers or hand   - MODERATE: fingers and hand are swollen   - SEVERE: swelling of entire hand and up into forearm     Mild now. This am upon awakening hands hard to open  4. REDNESS: "Does the swelling look red or infected?"     No  5. PAIN: "Is the swelling  painful to touch?" If Yes, ask: "How painful is it?"   (Scale 1-10; mild, moderate or severe)     2-3  6. FEVER: "Do you have a fever?" If Yes, ask: "What is it, how was it measured, and when did it start?"      na 7. CAUSE: "What do you think is causing the hand swelling?" (e.g., heat, insect bite, pregnancy, recent injury)     Not sure did have viral infection within the last week  8. MEDICAL HISTORY: "Do you have a history of heart failure, kidney disease, liver failure, or cancer?"     na 9. RECURRENT SYMPTOM: "Have you had hand swelling before?" If Yes, ask: "When was the last time?" "What happened that time?"     No  10. OTHER SYMPTOMS: "Do you have any other symptoms?" (e.g., blurred vision, difficulty breathing, headache)       Bilateral hand swelling , bilateral knees, legs swelling mild. Joint soreness fatigue. Dx viral infection last week  11. PREGNANCY: "Is there any chance you are pregnant?" "When was your last menstrual period?"       na  Protocols used: Hand Swelling-A-AH

## 2023-06-25 ENCOUNTER — Ambulatory Visit (INDEPENDENT_AMBULATORY_CARE_PROVIDER_SITE_OTHER): Payer: BC Managed Care – PPO | Admitting: Family Medicine

## 2023-06-25 ENCOUNTER — Encounter: Payer: Self-pay | Admitting: Family Medicine

## 2023-06-25 VITALS — BP 113/80 | HR 80 | Ht 69.0 in | Wt 260.0 lb

## 2023-06-25 DIAGNOSIS — R5383 Other fatigue: Secondary | ICD-10-CM | POA: Diagnosis not present

## 2023-06-25 DIAGNOSIS — M25541 Pain in joints of right hand: Secondary | ICD-10-CM | POA: Diagnosis not present

## 2023-06-25 DIAGNOSIS — R609 Edema, unspecified: Secondary | ICD-10-CM

## 2023-06-25 DIAGNOSIS — M25542 Pain in joints of left hand: Secondary | ICD-10-CM | POA: Diagnosis not present

## 2023-06-25 LAB — POCT URINALYSIS DIPSTICK
Bilirubin, UA: NEGATIVE
Blood, UA: NEGATIVE
Glucose, UA: NEGATIVE
Leukocytes, UA: NEGATIVE
Nitrite, UA: NEGATIVE
Protein, UA: NEGATIVE
Spec Grav, UA: 1.02 (ref 1.010–1.025)
Urobilinogen, UA: 0.2 E.U./dL
pH, UA: 5 (ref 5.0–8.0)

## 2023-06-25 NOTE — Progress Notes (Signed)
Established patient visit   Patient: Bianca Lee   DOB: Aug 28, 1976   46 y.o. Female  MRN: [id number] Visit Date: 06/25/2023  Today's healthcare provider: Mila Merry, MD   Chief Complaint  Patient presents with   Hand Pain    bilateral hands swelling/ legs swelling/fatigue, sore,warm sensation-4 days   Subjective    Discussed the use of AI scribe software for clinical note transcription with the patient, who gave verbal consent to proceed.  History of Present Illness   The patient presents with a recent onset of hand swelling and joint pain. She woke up one morning last week to find her hands 'clawed' and unable to open them. The hands were also sore, and the patient initially attributed this to sleeping in an awkward position. However, the discomfort persisted throughout the day, and the following morning, the patient experienced similar symptoms, with the inability to grip objects with two fingers due to the swelling. The patient took Aleve, which provided some relief.  The patient has never experienced hand swelling before, even during pregnancy when she had swollen feet. The swelling has improved since its onset, but the hands remain slightly swollen and tight in the mornings. The patient denies any recent changes in diet, unusual activity, or new medications. She also denies any shortness of breath associated with the swelling, but did note occasional sharp pain under the left rib.  The patient also reported a recent illness the week prior, characterized by a fever lasting five days. During this period, she experienced wrist pain and noticed an elevated heart rate when the fever spiked. The patient also has a history of arthritis in the foot joints, which are consistently sore. She has noticed slight swelling in the feet as well.  The patient's family history includes rheumatoid arthritis, and she reports she has previously had high inflammatory markers. She denies any  recent sore throat or cold symptoms.       Medications: Outpatient Medications Prior to Visit  Medication Sig   diphenhydrAMINE (BENADRYL) 25 MG tablet Take 25 mg by mouth every evening.   sertraline (ZOLOFT) 50 MG tablet Take 50 mg by mouth daily.   [DISCONTINUED] albuterol (VENTOLIN HFA) 108 (90 Base) MCG/ACT inhaler TAKE 2 PUFFS BY MOUTH EVERY 6 HOURS AS NEEDED FOR WHEEZE OR SHORTNESS OF BREATH (Patient not taking: Reported on 04/13/2023)   [DISCONTINUED] benzonatate (TESSALON) 100 MG capsule Take 2 capsules (200 mg total) by mouth 3 (three) times daily as needed for cough. (Patient not taking: Reported on 04/13/2023)   [DISCONTINUED] colestipol (COLESTID) 1 g tablet Take 1 tablet (1 g total) by mouth 2 (two) times daily. (Patient not taking: Reported on 01/09/2020)   [DISCONTINUED] guaiFENesin-dextromethorphan (ROBITUSSIN DM) 100-10 MG/5ML syrup Take 5 mLs by mouth every 4 (four) hours as needed for cough. (Patient not taking: Reported on 04/13/2023)   No facility-administered medications prior to visit.        Objective    BP 113/80 (BP Location: Right Arm, Patient Position: Sitting, Cuff Size: Large)   Pulse 80   Ht 5\' 9"  (1.753 m)   Wt 260 lb (117.9 kg)   SpO2 98%   BMI 38.40 kg/m    Physical Exam   General: Appearance:    Obese female in no acute distress  Eyes:    PERRL, conjunctiva/corneas clear, EOM's intact       Lungs:     Clear to auscultation bilaterally, respirations unlabored  Heart:  Normal heart rate. Normal rhythm. No murmurs, rubs, or gallops.    MS:   All extremities are intact.  FROM finger, hands and arms. No erythema. Trace non-pitting edema. No joint swelling.   Neurologic:   Awake, alert, oriented x 3. No apparent focal neurological defect.            Results for orders placed or performed in visit on 06/25/23  POCT urinalysis dipstick  Result Value Ref Range   Color, UA yellow    Clarity, UA clear    Glucose, UA Negative Negative   Bilirubin,  UA neg    Ketones, UA trace    Spec Grav, UA 1.020 1.010 - 1.025   Blood, UA neg    pH, UA 5.0 5.0 - 8.0   Protein, UA Negative Negative   Urobilinogen, UA 0.2 0.2 or 1.0 E.U./dL   Nitrite, UA neg    Leukocytes, UA Negative Negative   Appearance     Odor      Assessment & Plan     Assessment and Plan    Acute Bilateral Hand Swelling: New onset 5 days ago improved since then, severe in the morning, improving throughout the day. Associated with joint pain. No clear precipitating factors. No associated dyspnea, palpitations, or recent illness. Family history of rheumatoid arthritis. -Order comprehensive metabolic panel, complete blood count, thyroid function tests, inflammatory markers, and rheumatoid factor.  2. Other fatigue  3. Arthralgia of both hands    - Comprehensive metabolic panel - T4, free - TSH - CBC with Differential/Platelet - Sed Rate (ESR) - Uric acid - ANA Direct w/Reflex if Positive - Rheumatoid Factor           Mila Merry, MD  [facility] [phone] (phone) [phone] (fax)  [facility]

## 2023-06-26 LAB — COMPREHENSIVE METABOLIC PANEL
ALT: 28 IU/L (ref 0–32)
AST: 31 IU/L (ref 0–40)
Albumin: 4.2 g/dL (ref 3.9–4.9)
Alkaline Phosphatase: 74 IU/L (ref 44–121)
BUN/Creatinine Ratio: 16 (ref 9–23)
BUN: 12 mg/dL (ref 6–24)
Bilirubin Total: <0.2 mg/dL (ref 0.0–1.2)
CO2: 23 mmol/L (ref 20–29)
Calcium: 9.4 mg/dL (ref 8.7–10.2)
Chloride: 105 mmol/L (ref 96–106)
Creatinine, Ser: 0.75 mg/dL (ref 0.57–1.00)
Globulin, Total: 2.7 g/dL (ref 1.5–4.5)
Glucose: 96 mg/dL (ref 70–99)
Potassium: 4.4 mmol/L (ref 3.5–5.2)
Sodium: 140 mmol/L (ref 134–144)
Total Protein: 6.9 g/dL (ref 6.0–8.5)
eGFR: 100 mL/min/{1.73_m2} (ref >59)

## 2023-06-26 LAB — CBC WITH DIFFERENTIAL/PLATELET
Basophils Absolute: 0.1 10*3/uL (ref 0.0–0.2)
Basos: 2 %
EOS (ABSOLUTE): 0.1 10*3/uL (ref 0.0–0.4)
Eos: 2 %
Hematocrit: 36.8 % (ref 34.0–46.6)
Hemoglobin: 11.8 g/dL (ref 11.1–15.9)
Immature Grans (Abs): 0 10*3/uL (ref 0.0–0.1)
Immature Granulocytes: 1 %
Lymphocytes Absolute: 1.5 10*3/uL (ref 0.7–3.1)
Lymphs: 25 %
MCH: 26.5 pg — ABNORMAL LOW (ref 26.6–33.0)
MCHC: 32.1 g/dL (ref 31.5–35.7)
MCV: 83 fL (ref 79–97)
Monocytes Absolute: 0.7 10*3/uL (ref 0.1–0.9)
Monocytes: 11 %
Neutrophils Absolute: 3.7 10*3/uL (ref 1.4–7.0)
Neutrophils: 59 %
Platelets: 298 10*3/uL (ref 150–450)
RBC: 4.46 x10E6/uL (ref 3.77–5.28)
RDW: 14.8 % (ref 11.7–15.4)
WBC: 6.2 10*3/uL (ref 3.4–10.8)

## 2023-06-26 LAB — ANA W/REFLEX IF POSITIVE: Anti Nuclear Antibody (ANA): NEGATIVE

## 2023-06-26 LAB — URIC ACID: Uric Acid: 4.5 mg/dL (ref 2.6–6.2)

## 2023-06-26 LAB — RHEUMATOID FACTOR: Rheumatoid fact SerPl-aCnc: <10 IU/mL (ref <14.0)

## 2023-06-26 LAB — SEDIMENTATION RATE: Sed Rate: 21 mm/hr (ref 0–32)

## 2023-06-26 LAB — T4, FREE: Free T4: 1.14 ng/dL (ref 0.82–1.77)

## 2023-06-26 LAB — TSH: TSH: 1.86 u[IU]/mL (ref 0.450–4.500)

## 2023-07-12 ENCOUNTER — Encounter: Payer: Self-pay | Admitting: Gastroenterology

## 2023-09-26 ENCOUNTER — Encounter: Payer: Self-pay | Admitting: Gastroenterology

## 2023-09-26 ENCOUNTER — Ambulatory Visit: Payer: BC Managed Care – PPO | Admitting: Gastroenterology

## 2023-09-26 ENCOUNTER — Telehealth: Payer: Self-pay | Admitting: Oncology

## 2023-09-26 VITALS — BP 122/80 | HR 76 | Ht 69.0 in | Wt 263.0 lb

## 2023-09-26 DIAGNOSIS — K529 Noninfective gastroenteritis and colitis, unspecified: Secondary | ICD-10-CM | POA: Diagnosis not present

## 2023-09-26 DIAGNOSIS — Z800 Family history of malignant neoplasm of digestive organs: Secondary | ICD-10-CM | POA: Diagnosis not present

## 2023-09-26 MED ORDER — NA SULFATE-K SULFATE-MG SULF 17.5-3.13-1.6 GM/177ML PO SOLN: 1.0000 | Freq: Once | ORAL | 0 refills | Status: AC

## 2023-09-26 NOTE — Progress Notes (Signed)
09/26/2023 Bianca Lee [id number] 05/04/1976   HISTORY OF PRESENT ILLNESS:  This is a 47 year old female who was previously a patient of Dr. Christella Hartigan.  Her care will be assumed by Dr. Meridee Score.  She is here today to discuss and schedule colonoscopy.  She has a family history of rectal cancer in her father.  She had a normal colonoscopy in 2013 and again in 2017.  She has had longstanding complaints of diarrhea that are at this point thought to be IBS related, may be bile salt related from postcholecystectomy state.  She has tried Questran in the past and has had improvement with that.  Would like to resume that again.  Has had normal random biopsies from colonoscopy as well.  Continues to have intermittent episodes of diarrhea where she will have diarrhea for several days in a row.  Right now she is doing well.  When she has the diarrhea it tends to be worse in the mornings and then tapers off throughout the day.  Describes a very small amount of rectal bleeding just on the toilet paper when she is having lots of diarrhea and having to wipe often.   Past Medical History:  Diagnosis Date   Arthritis    Asthma    Childhood   Cancer (HCC)    basal cell carcinoma   Clotting disorder (HCC)    Factor 5 Leiden mutation, heterozygous (HCC)    Foot fracture    Gallstone    IBS (irritable bowel syndrome)    Obesity    Past Surgical History:  Procedure Laterality Date   CESAREAN SECTION N/A 08/01/2014   Procedure: CESAREAN SECTION;  Surgeon: Leslie Andrea, MD;  Location: [facility];  Service: Obstetrics;  Laterality: N/A;  arrest of descent   CHOLECYSTECTOMY     TONSILLECTOMY      reports that she quit smoking about 26 years ago. Her smoking use included cigarettes. She started smoking about 30 years ago. She has a 1 pack-year smoking history. She has never used smokeless tobacco. She reports that she does not drink alcohol and does not use drugs. family history includes Arthritis in  her maternal aunt and mother; COPD in her maternal grandmother; Cancer in her paternal aunt; Cataracts in her maternal grandmother and mother; Clotting disorder in her father and sister; Crohn's disease in her maternal aunt; Diabetes in her father and paternal aunt; Heart disease in her maternal grandfather and maternal uncle; Hypertension in her mother; Irritable bowel syndrome in her maternal aunt; Lung cancer in her maternal grandmother; Prostate cancer in her paternal uncle; Rectal cancer in her paternal uncle; Rectal cancer (age of onset: 30) in her father; Rheum arthritis in her paternal grandmother; Skin cancer in her mother; Stroke in her maternal grandmother and paternal aunt. Allergies  Allergen Reactions   Morphine And Codeine Other (See Comments)    Pt states that this medication gave her a headache.       Outpatient Encounter Medications as of 09/26/2023  Medication Sig   diphenhydrAMINE (BENADRYL) 25 MG tablet Take 25 mg by mouth every evening.   sertraline (ZOLOFT) 50 MG tablet Take 50 mg by mouth daily.   No facility-administered encounter medications on file as of 09/26/2023.     REVIEW OF SYSTEMS  : All other systems reviewed and negative except where noted in the History of Present Illness.   PHYSICAL EXAM: BP 122/80   Pulse 76   Ht 5\' 9"  (1.753  m)   Wt 263 lb (119.3 kg)   BMI 38.84 kg/m  General: Well developed white female in no acute distress Head: Normocephalic and atraumatic Eyes:  Sclerae anicteric, conjunctiva pink. Ears: Normal auditory acuity Lungs: Clear throughout to auscultation; no W/R/R. Heart: Regular rate and rhythm; no M/R/G. Abdomen: Soft, non-distended.  BS present.  Non-tender. Rectal:  Will be done at the time of colonoscopy. Musculoskeletal: Symmetrical with no gross deformities  Skin: No lesions on visible extremities Extremities: No edema  Neurological: Alert oriented x 4, grossly non-focal Psychological:  Alert and cooperative. Normal  mood and affect  ASSESSMENT AND PLAN: *FH of rectal cancer in her father:  Her last colonoscopy was in 09/2016.  Will schedule with Dr. Meridee Score.  The risks, benefits, and alternatives to colonoscopy were discussed with the patient and she consents to proceed.  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  She did well with Questran in the past.  Would like to resume that.  Will send a prescription to her pharmacy.  We also discussed trying lactose-free diet and she does drink some diet sodas so we discussed artificial sweeteners and trying to eliminate those as well.  Previous colonoscopies without cause of diarrhea identified, including normal biopsies.  CC:  Erasmo Downer, MD

## 2023-09-26 NOTE — Patient Instructions (Signed)
We have sent the following medications to your pharmacy for you to pick up at your convenience: Questran 1 pack daily.   You have been scheduled for a colonoscopy. Please follow written instructions given to you at your visit today.   Please pick up your prep supplies at the pharmacy within the next 1-3 days.  If you use inhalers (even only as needed), please bring them with you on the day of your procedure.  DO NOT TAKE 7 DAYS PRIOR TO TEST- Trulicity (dulaglutide) Ozempic, Wegovy (semaglutide) Mounjaro (tirzepatide) Bydureon Bcise (exanatide extended release)  DO NOT TAKE 1 DAY PRIOR TO YOUR TEST Rybelsus (semaglutide) Adlyxin (lixisenatide) Victoza (liraglutide) Byetta (exanatide) ___________________________________________________________________________  _______________________________________________________  If your blood pressure at your visit was 140/90 or greater, please contact your primary care physician to follow up on this.  _______________________________________________________  If you are age 46 or older, your body mass index should be between 23-30. Your Body mass index is 38.84 kg/m. If this is out of the aforementioned range listed, please consider follow up with your Primary Care Provider.  If you are age 23 or younger, your body mass index should be between 19-25. Your Body mass index is 38.84 kg/m. If this is out of the aformentioned range listed, please consider follow up with your Primary Care Provider.   ________________________________________________________  The [facility] providers would like to encourage you to use [facility] to communicate with providers for non-urgent requests or questions.  Due to long hold times on the telephone, sending your provider a message by [facility] may be a faster and more efficient way to get a response.  Please allow 48 business hours for a response.  Please remember that this is for non-urgent requests.   _______________________________________________________

## 2023-09-26 NOTE — Telephone Encounter (Signed)
Patient no longer would like to follow-up care at the moment with provider. Appointments cancelled.

## 2023-09-27 NOTE — Progress Notes (Signed)
Attending Physician's Attestation   I have reviewed the chart.   I agree with the Advanced Practitioner's note, impression, and recommendations with any updates as below.    Emersyn Wyss Mansouraty, MD [facility] Office # [phone]  

## 2023-10-12 ENCOUNTER — Other Ambulatory Visit: Payer: BC Managed Care – PPO

## 2023-10-12 ENCOUNTER — Ambulatory Visit: Payer: BC Managed Care – PPO | Admitting: Oncology

## 2023-11-23 ENCOUNTER — Encounter: Payer: Self-pay | Admitting: Gastroenterology

## 2023-12-04 ENCOUNTER — Encounter: Payer: Self-pay | Admitting: Gastroenterology

## 2023-12-04 ENCOUNTER — Ambulatory Visit: Payer: BC Managed Care – PPO | Admitting: Gastroenterology

## 2023-12-04 VITALS — BP 117/78 | HR 84 | Temp 98.0°F | Resp 16 | Ht 69.0 in | Wt 263.0 lb

## 2023-12-04 DIAGNOSIS — K621 Rectal polyp: Secondary | ICD-10-CM

## 2023-12-04 DIAGNOSIS — Z800 Family history of malignant neoplasm of digestive organs: Secondary | ICD-10-CM

## 2023-12-04 DIAGNOSIS — K644 Residual hemorrhoidal skin tags: Secondary | ICD-10-CM | POA: Diagnosis not present

## 2023-12-04 DIAGNOSIS — D128 Benign neoplasm of rectum: Secondary | ICD-10-CM

## 2023-12-04 DIAGNOSIS — K641 Second degree hemorrhoids: Secondary | ICD-10-CM

## 2023-12-04 DIAGNOSIS — Z1211 Encounter for screening for malignant neoplasm of colon: Secondary | ICD-10-CM

## 2023-12-04 DIAGNOSIS — K6289 Other specified diseases of anus and rectum: Secondary | ICD-10-CM | POA: Diagnosis not present

## 2023-12-04 DIAGNOSIS — K529 Noninfective gastroenteritis and colitis, unspecified: Secondary | ICD-10-CM

## 2023-12-04 MED ORDER — CHOLESTYRAMINE 4 G PO PACK: 4.0000 g | PACK | Freq: Two times a day (BID) | ORAL | 12 refills | Status: DC

## 2023-12-04 MED ORDER — SODIUM CHLORIDE 0.9 % IV SOLN: 500.0000 mL | Freq: Once | INTRAVENOUS | Status: DC

## 2023-12-04 NOTE — Op Note (Signed)
[facility] Patient Name: Bianca Lee Procedure Date: 12/04/2023 1:27 PM MRN: [id number] Endoscopist: Corliss Parish , MD, [id number] Age: 47 Referring MD:  Date of Birth: 08/28/1976 Gender: Female Account #: [id number] Procedure:                Colonoscopy Indications:              Screening patient at increased risk: Family history                            of 1st-degree relative with colorectal cancer Medicines:                Monitored Anesthesia Care Procedure:                Pre-Anesthesia Assessment:                           - Prior to the procedure, a History and Physical                            was performed, and patient medications and                            allergies were reviewed. The patient's tolerance of                            previous anesthesia was also reviewed. The risks                            and benefits of the procedure and the sedation                            options and risks were discussed with the patient.                            All questions were answered, and informed consent                            was obtained. Prior Anticoagulants: The patient has                            taken no anticoagulant or antiplatelet agents. ASA                            Grade Assessment: II - A patient with mild systemic                            disease. After reviewing the risks and benefits,                            the patient was deemed in satisfactory condition to                            undergo the procedure.  After obtaining informed consent, the colonoscope                            was passed under direct vision. Throughout the                            procedure, the patient's blood pressure, pulse, and                            oxygen saturations were monitored continuously. The                            Olympus Scope SN: [id number] was introduced through                            the  anus and advanced to the 3 cm into the ileum.                            The colonoscopy was performed without difficulty.                            The patient tolerated the procedure. The quality of                            the bowel preparation was good. The terminal ileum,                            ileocecal valve, appendiceal orifice, and rectum                            were photographed. Scope In: 1:37:36 PM Scope Out: 1:47:55 PM Scope Withdrawal Time: 0 hours 7 minutes 36 seconds  Total Procedure Duration: 0 hours 10 minutes 19 seconds  Findings:                 Skin tags were found on perianal exam.                           The digital rectal exam findings include                            hemorrhoids. Pertinent negatives include no                            palpable rectal lesions.                           The terminal ileum and ileocecal valve appeared                            normal.                           A 4 mm polyp was found in the distal rectum. The  polyp was sessile. The polyp was removed with a                            cold snare. Resection and retrieval were complete.                           Normal mucosa was found in the entire colon                            otherwise.                           Non-bleeding non-thrombosed external and internal                            hemorrhoids were found during retroflexion, during                            perianal exam and during digital exam. The                            hemorrhoids were Grade II (internal hemorrhoids                            that prolapse but reduce spontaneously). Complications:            No immediate complications. Estimated Blood Loss:     Estimated blood loss was minimal. Impression:               - Perianal skin tags found on perianal exam.                           - Hemorrhoids found on digital rectal exam.                           - The examined  portion of the ileum was normal.                           - One 4 mm polyp in the distal rectum, removed with                            a cold snare. Resected and retrieved.                           - Normal mucosa in the entire examined colon                            otherwise.                           - Non-bleeding non-thrombosed external and internal                            hemorrhoids. Recommendation:           - The patient will be observed post-procedure,  until all discharge criteria are met.                           - Discharge patient to home.                           - Patient has a contact number available for                            emergencies. The signs and symptoms of potential                            delayed complications were discussed with the                            patient. Return to normal activities tomorrow.                            Written discharge instructions were provided to the                            patient.                           - High fiber diet.                           - Use FiberCon 1-2 tablets PO daily.                           - Continue present medications.                           - Await pathology results.                           - Repeat colonoscopy in 5 years for                            screening/surveillance no matter pathology based on                            family history.                           - We will make sure to have cholestyramine sent to                            pharmacy for attempt at treatment of bile salt                            diarrhea presumptively. Other treatments for IBS-D                            versus evaluation for SIBO/PPI should be considered  in future.                           - The findings and recommendations were discussed                            with the patient.                           - The findings and  recommendations were discussed                            with the patient's family. Corliss Parish, MD 12/04/2023 1:53:44 PM

## 2023-12-04 NOTE — Progress Notes (Signed)
Pt's states no medical or surgical changes since previsit or office visit. 

## 2023-12-04 NOTE — Progress Notes (Signed)
Called to room to assist during endoscopic procedure.  Patient ID and intended procedure confirmed with present staff. Received instructions for my participation in the procedure from the performing physician.  

## 2023-12-04 NOTE — Progress Notes (Signed)
A/O x 3, gd SR's, VSS, report to RN

## 2023-12-04 NOTE — Patient Instructions (Addendum)
Await pathology results.  High fiber diet.  Use FiberCon 1-2 tablets daily.  Continue present medications. A prescription for Cholestyramine sent to pharmacy.  Handout on polyps provided.  YOU HAD AN ENDOSCOPIC PROCEDURE TODAY AT THE [facility]:   Refer to the procedure report that was given to you for any specific questions about what was found during the examination.  If the procedure report does not answer your questions, please call your gastroenterologist to clarify.  If you requested that your care partner not be given the details of your procedure findings, then the procedure report has been included in a sealed envelope for you to review at your convenience later.  YOU SHOULD EXPECT: Some feelings of bloating in the abdomen. Passage of more gas than usual.  Walking can help get rid of the air that was put into your GI tract during the procedure and reduce the bloating. If you had a lower endoscopy (such as a colonoscopy or flexible sigmoidoscopy) you may notice spotting of blood in your stool or on the toilet paper. If you underwent a bowel prep for your procedure, you may not have a normal bowel movement for a few days.  Please Note:  You might notice some irritation and congestion in your nose or some drainage.  This is from the oxygen used during your procedure.  There is no need for concern and it should clear up in a day or so.  SYMPTOMS TO REPORT IMMEDIATELY:  Following lower endoscopy (colonoscopy or flexible sigmoidoscopy):  Excessive amounts of blood in the stool  Significant tenderness or worsening of abdominal pains  Swelling of the abdomen that is new, acute  Fever of 100F or higher   For urgent or emergent issues, a gastroenterologist can be reached at any hour by calling [phone]. Do not use MyChart messaging for urgent concerns.    DIET:  We do recommend a small meal at first, but then you may proceed to your regular diet.  Drink plenty of  fluids but you should avoid alcoholic beverages for 24 hours.  ACTIVITY:  You should plan to take it easy for the rest of today and you should NOT DRIVE or use heavy machinery until tomorrow (because of the sedation medicines used during the test).    FOLLOW UP: Our staff will call the number listed on your records the next business day following your procedure.  We will call around 7:15- 8:00 am to check on you and address any questions or concerns that you may have regarding the information given to you following your procedure. If we do not reach you, we will leave a message.     If any biopsies were taken you will be contacted by phone or by letter within the next 1-3 weeks.  Please call us at [phone] if you have not heard about the biopsies in 3 weeks.    SIGNATURES/CONFIDENTIALITY: You and/or your care partner have signed paperwork which will be entered into your electronic medical record.  These signatures attest to the fact that that the information above on your After Visit Summary has been reviewed and is understood.  Full responsibility of the confidentiality of this discharge information lies with you and/or your care-partner.

## 2023-12-04 NOTE — Progress Notes (Signed)
GASTROENTEROLOGY PROCEDURE H&P NOTE   Primary Care Physician: Erasmo Downer, MD  HPI: Bianca Lee is a 47 y.o. female who presents for Colonoscopy for FHx of Rectal cancer and bowel habit changes.  Past Medical History:  Diagnosis Date   Arthritis    Asthma    Childhood   Cancer (HCC)    basal cell carcinoma   Clotting disorder (HCC)    Factor 5 Leiden mutation, heterozygous (HCC)    Foot fracture    Gallstone    IBS (irritable bowel syndrome)    Obesity    Past Surgical History:  Procedure Laterality Date   CESAREAN SECTION N/A 08/01/2014   Procedure: CESAREAN SECTION;  Surgeon: Leslie Andrea, MD;  Location: [facility];  Service: Obstetrics;  Laterality: N/A;  arrest of descent   CHOLECYSTECTOMY     TONSILLECTOMY     Current Outpatient Medications  Medication Sig Dispense Refill   diphenhydrAMINE (BENADRYL) 25 MG tablet Take 25 mg by mouth every evening.     sertraline (ZOLOFT) 50 MG tablet Take 50 mg by mouth daily.     No current facility-administered medications for this visit.    Current Outpatient Medications:    diphenhydrAMINE (BENADRYL) 25 MG tablet, Take 25 mg by mouth every evening., Disp: , Rfl:    sertraline (ZOLOFT) 50 MG tablet, Take 50 mg by mouth daily., Disp: , Rfl:  Allergies  Allergen Reactions   Morphine And Codeine Other (See Comments)    Pt states that this medication gave her a headache.    Family History  Problem Relation Age of Onset   Skin cancer Mother    Arthritis Mother    Hypertension Mother    Cataracts Mother    Rectal cancer Father 76   Clotting disorder Father        Factor 5 Leiden   Diabetes Father    Clotting disorder Sister        Factor 5 Leiden heterozygous   Lung cancer Maternal Grandmother    COPD Maternal Grandmother    Stroke Maternal Grandmother    Cataracts Maternal Grandmother    Heart disease Maternal Grandfather    Rheum arthritis Paternal Grandmother    Crohn's disease Maternal Aunt     Irritable bowel syndrome Maternal Aunt    Arthritis Maternal Aunt    Heart disease Maternal Uncle    Diabetes Paternal Aunt    Cancer Paternal Aunt        unknown type   Stroke Paternal Aunt    Rectal cancer Paternal Uncle    Prostate cancer Paternal Uncle    Liver disease Neg Hx    Esophageal cancer Neg Hx    Social History   Socioeconomic History   Marital status: Divorced    Spouse name: Not on file   Number of children: 1   Years of education: Not on file   Highest education level: Not on file  Occupational History   Occupation: high school counselor  Tobacco Use   Smoking status: Former    Current packs/day: 0.00    Average packs/day: 0.3 packs/day for 4.0 years (1.0 ttl pk-yrs)    Types: Cigarettes    Start date: 52    Quit date: 1998    Years since quitting: 26.9   Smokeless tobacco: Never  Vaping Use   Vaping status: Never Used  Substance and Sexual Activity   Alcohol use: No    Alcohol/week: 0.0 standard drinks of alcohol  Drug use: No   Sexual activity: Yes    Partners: Male    Birth control/protection: I.U.D.  Other Topics Concern   Not on file  Social History Narrative   Not on file   Social Drivers of Health   Financial Resource Strain: Not on file  Food Insecurity: Not on file  Transportation Needs: Not on file  Physical Activity: Not on file  Stress: Not on file  Social Connections: Not on file  Intimate Partner Violence: Not on file    Physical Exam: There were no vitals filed for this visit. There is no height or weight on file to calculate BMI. GEN: NAD EYE: Sclerae anicteric ENT: MMM CV: Non-tachycardic GI: Soft, NT/ND NEURO:  Alert & Oriented x 3  Lab Results: No results for input(s): "WBC", "HGB", "HCT", "PLT" in the last 72 hours. BMET No results for input(s): "NA", "K", "CL", "CO2", "GLUCOSE", "BUN", "CREATININE", "CALCIUM" in the last 72 hours. LFT No results for input(s): "PROT", "ALBUMIN", "AST", "ALT", "ALKPHOS",  "BILITOT", "BILIDIR", "IBILI" in the last 72 hours. PT/INR No results for input(s): "LABPROT", "INR" in the last 72 hours.   Impression / Plan: This is a Bianca Lee y.o.female who presents for Colonoscopy for FHx of Rectal cancer and bowel habit changes.  The risks and benefits of endoscopic evaluation/treatment were discussed with the patient and/or family; these include but are not limited to the risk of perforation, infection, bleeding, missed lesions, lack of diagnosis, severe illness requiring hospitalization, as well as anesthesia and sedation related illnesses.  The patient's history has been reviewed, patient examined, no change in status, and deemed stable for procedure.  The patient and/or family is agreeable to proceed.    Corliss Parish, MD [facility] Advanced [facility]oscopy Office # [phone]

## 2023-12-05 ENCOUNTER — Telehealth: Payer: Self-pay | Admitting: *Deleted

## 2023-12-05 NOTE — Telephone Encounter (Signed)
  Follow up Call-     12/04/2023    1:00 PM  Call back number  Post procedure Call Back phone  # [phone]  Permission to leave phone message Yes     Patient questions:   Message left to call if necessary.Marland Kitchen

## 2023-12-10 LAB — SURGICAL PATHOLOGY

## 2023-12-13 ENCOUNTER — Encounter: Payer: Self-pay | Admitting: Gastroenterology

## 2024-04-11 ENCOUNTER — Telehealth: Payer: Self-pay

## 2024-04-11 NOTE — Telephone Encounter (Signed)
 Mary from [facility] called and request surgical clearance. Patient has not been seen here in over a year provider she saw no longer works here. Chart reviewed by a provider here and patient would need a visit to obtain surgical clearance . Detailed message left for [facility]

## 2024-04-14 ENCOUNTER — Telehealth: Payer: Self-pay | Admitting: Oncology

## 2024-04-16 ENCOUNTER — Inpatient Hospital Stay (HOSPITAL_BASED_OUTPATIENT_CLINIC_OR_DEPARTMENT_OTHER): Payer: Self-pay | Admitting: Oncology

## 2024-04-16 ENCOUNTER — Other Ambulatory Visit: Payer: Self-pay | Admitting: Oncology

## 2024-04-16 ENCOUNTER — Encounter: Payer: Self-pay | Admitting: Oncology

## 2024-04-16 ENCOUNTER — Inpatient Hospital Stay: Payer: Self-pay | Attending: Oncology

## 2024-04-16 VITALS — BP 135/97 | HR 70 | Temp 97.7°F | Resp 20 | Wt 266.7 lb

## 2024-04-16 DIAGNOSIS — Z7901 Long term (current) use of anticoagulants: Secondary | ICD-10-CM | POA: Diagnosis not present

## 2024-04-16 DIAGNOSIS — D6851 Activated protein C resistance: Secondary | ICD-10-CM | POA: Insufficient documentation

## 2024-04-16 DIAGNOSIS — N920 Excessive and frequent menstruation with regular cycle: Secondary | ICD-10-CM | POA: Insufficient documentation

## 2024-04-16 DIAGNOSIS — Z832 Family history of diseases of the blood and blood-forming organs and certain disorders involving the immune mechanism: Secondary | ICD-10-CM | POA: Insufficient documentation

## 2024-04-16 DIAGNOSIS — Z79899 Other long term (current) drug therapy: Secondary | ICD-10-CM | POA: Insufficient documentation

## 2024-04-16 DIAGNOSIS — Z8261 Family history of arthritis: Secondary | ICD-10-CM | POA: Insufficient documentation

## 2024-04-16 DIAGNOSIS — Z975 Presence of (intrauterine) contraceptive device: Secondary | ICD-10-CM | POA: Insufficient documentation

## 2024-04-16 DIAGNOSIS — M19071 Primary osteoarthritis, right ankle and foot: Secondary | ICD-10-CM | POA: Diagnosis not present

## 2024-04-16 LAB — IRON AND IRON BINDING CAPACITY (CC-WL,HP ONLY)
Iron: 48 ug/dL (ref 28–170)
Saturation Ratios: 14 % (ref 10.4–31.8)
TIBC: 353 ug/dL (ref 250–450)
UIBC: 305 ug/dL (ref 148–442)

## 2024-04-16 LAB — CMP (CANCER CENTER ONLY)
ALT: 19 U/L (ref 0–44)
AST: 16 U/L (ref 15–41)
Albumin: 4.5 g/dL (ref 3.5–5.0)
Alkaline Phosphatase: 78 U/L (ref 38–126)
Anion gap: 6 (ref 5–15)
BUN: 11 mg/dL (ref 6–20)
CO2: 30 mmol/L (ref 22–32)
Calcium: 9.5 mg/dL (ref 8.9–10.3)
Chloride: 103 mmol/L (ref 98–111)
Creatinine: 0.86 mg/dL (ref 0.44–1.00)
GFR, Estimated: >60 mL/min (ref >60)
Glucose, Bld: 117 mg/dL — ABNORMAL HIGH (ref 70–99)
Potassium: 3.9 mmol/L (ref 3.5–5.1)
Sodium: 139 mmol/L (ref 135–145)
Total Bilirubin: 0.3 mg/dL (ref 0.0–1.2)
Total Protein: 7.6 g/dL (ref 6.5–8.1)

## 2024-04-16 LAB — CBC WITH DIFFERENTIAL (CANCER CENTER ONLY)
Abs Immature Granulocytes: 0.04 10*3/uL (ref 0.00–0.07)
Basophils Absolute: 0 10*3/uL (ref 0.0–0.1)
Basophils Relative: 1 %
Eosinophils Absolute: 0.2 10*3/uL (ref 0.0–0.5)
Eosinophils Relative: 2 %
HCT: 40.8 % (ref 36.0–46.0)
Hemoglobin: 13.3 g/dL (ref 12.0–15.0)
Immature Granulocytes: 1 %
Lymphocytes Relative: 31 %
Lymphs Abs: 2.4 10*3/uL (ref 0.7–4.0)
MCH: 26.9 pg (ref 26.0–34.0)
MCHC: 32.6 g/dL (ref 30.0–36.0)
MCV: 82.4 fL (ref 80.0–100.0)
Monocytes Absolute: 0.6 10*3/uL (ref 0.1–1.0)
Monocytes Relative: 7 %
Neutro Abs: 4.6 10*3/uL (ref 1.7–7.7)
Neutrophils Relative %: 58 %
Platelet Count: 308 10*3/uL (ref 150–400)
RBC: 4.95 MIL/uL (ref 3.87–5.11)
RDW: 13.5 % (ref 11.5–15.5)
WBC Count: 7.7 10*3/uL (ref 4.0–10.5)
nRBC: 0 % (ref 0.0–0.2)

## 2024-04-16 LAB — D-DIMER, QUANTITATIVE: D-Dimer, Quant: 0.28 ug{FEU}/mL (ref 0.00–0.50)

## 2024-04-16 LAB — PROTIME-INR
INR: 1 (ref 0.8–1.2)
Prothrombin Time: 13.5 s (ref 11.4–15.2)

## 2024-04-16 LAB — APTT: aPTT: 27 s (ref 24–36)

## 2024-04-16 MED ORDER — APIXABAN 5 MG PO TABS: 5.0000 mg | ORAL_TABLET | Freq: Two times a day (BID) | ORAL | 0 refills | Status: DC

## 2024-04-16 NOTE — Progress Notes (Signed)
 [facility]  HEMATOLOGY CLINIC PROGRESS NOTE  PATIENT NAME: Bianca Lee   MR#: [id number] DOB: April 05, 1976  Patient Care Team: Mazie Speed, MD as PCP - General (Family Medicine) Thora Flint, MD as Consulting Physician (Obstetrics and Gynecology)  Date of visit: 04/16/2024   ASSESSMENT & PLAN:   Bianca Lee is a 48 y.o. lady with history of factor V Leiden mutation heterozygosity, no prior thromboembolism, asthma, irritable bowel syndrome was referred to us  for recommendations regarding postoperative anticoagulation.  Heterozygous factor V Leiden mutation (HCC) Factor V Leiden heterozygosity was identified through genetic testing due to a family history of DVTs, with her father experiencing multiple DVTs. She has no personal history of thrombosis. Her risk of clotting is increased six to eight-fold compared to the general population, but anticoagulation is not indicated without a documented clot.   She is scheduled for surgery on Apr 28, 2024, and requires prophylactic anticoagulation post-operatively due to increased thrombosis risk. Informed consent was obtained for Eliquis, preferred over Lovenox  for ease of administration, as it is a tablet taken twice daily.  - Order Factor V Leiden mutation test, prothrombin gene mutation test (as it can coexist) and D-dimer test to assess clotting risk.  D-dimer was low at 0.28 today.  - Prescribed Eliquis for prophylactic anticoagulation for four weeks post-surgery.  - Coordinate with Dr. Tomblin regarding timing of anticoagulation initiation post-surgery.  - Arrange for a follow-up phone call on Apr 24, 2024, to discuss test results and any necessary changes to the plan.  Menorrhagia with regular cycle Heavy menstrual bleeding is likely related to IUD use. She is scheduled for tubal ligation and ablation on Apr 28, 2024. - Check iron levels to assess for anemia due to heavy menstrual  bleeding.   Follow-up - Schedule a follow-up phone call on Apr 24, 2024, to discuss blood test results. - Schedule an annual follow-up appointment in one year to monitor condition and reassess as needed.    I spent a total of 30 minutes during this encounter with the patient including review of chart and various tests results, discussions about plan of care and coordination of care plan.  I reviewed lab results and outside records for this visit and discussed relevant results with the patient. Diagnosis, plan of care and treatment options were also discussed in detail with the patient. Opportunity provided to ask questions and answers provided to her apparent satisfaction. Provided instructions to call our clinic with any problems, questions or concerns prior to return visit. I recommended to continue follow-up with PCP and sub-specialists. She verbalized understanding and agreed with the plan. No barriers to learning was detected.  Arlo Berber, MD  04/16/2024 5:33 PM  [facility] - A DEPT [facility] [address] [place] [address] Dept: [phone] Dept Fax: [phone]   CHIEF COMPLAINT/ REASON FOR VISIT:  Recommendations for post-op anticoagulation  INTERVAL HISTORY:  Discussed the use of AI scribe software for clinical note transcription with the patient, who gave verbal consent to proceed.  History of Present Illness Bianca Lee "Bianca Lee" is a 48 year old female with factor V Leiden heterozygosity who presents for pre-surgical evaluation and blood disorder assessment. She was referred by Dr. James Tomblin for pre-surgical clearance.  She is scheduled for surgery on May 12th for a breast procedure and endometrial ablation. Her doctor requested a hematology consultation to assess her risk of  clotting and to determine the need for prophylactic anticoagulation post-surgery.  She has a known history of  factor V Leiden heterozygosity, identified several years ago after her father, who has had multiple deep vein thromboses (DVTs) in his legs and lungs, was diagnosed. Following her father's diagnosis, she and her sister were tested and found to be heterozygous for the mutation. During her pregnancy in 2015, she was prescribed Lovenox  postpartum as a precautionary measure.  No current symptoms suggestive of a clot, such as leg swelling, pain, or redness. However, she experienced a severe cramp in her calf on Sunday, which has since been sore. She also reports morning stiffness and swelling in her hands, which began in July. She has been tested for rheumatoid arthritis due to a family history but was not diagnosed. She has osteoarthritis in her feet and is concerned about her hands.  She experiences heavy menstrual bleeding, which she attributes to her IUD. She has undergone testing for rheumatoid arthritis, which was negative, and has a family history of rheumatoid arthritis. She has been evaluated by a rheumatologist in the past.   SUMMARY OF HEMATOLOGIC HISTORY:  She has a known history of factor V Leiden heterozygosity, identified several years ago after her father, who has had multiple deep vein thromboses (DVTs) in his legs and lungs, was diagnosed. Following her father's diagnosis, she and her sister were tested and found to be heterozygous for the mutation. During her pregnancy in 2015, she was prescribed Lovenox  postpartum as a precautionary measure.  No current symptoms suggestive of a clot, such as leg swelling, pain, or redness.  I have reviewed the past medical history, past surgical history, social history and family history with the patient and they are unchanged from previous note.  ALLERGIES: She is allergic to morphine  and codeine.  MEDICATIONS:  Current Outpatient Medications  Medication Sig Dispense Refill   apixaban (ELIQUIS) 5 MG TABS tablet Take 1 tablet (5 mg total) by mouth  2 (two) times daily. 60 tablet 0   diphenhydrAMINE  (BENADRYL ) 25 MG tablet Take 25 mg by mouth every evening.     naproxen sodium (ALEVE) 220 MG tablet Take 220 mg by mouth.     omeprazole (PRILOSEC) 20 MG capsule Take 20 mg by mouth daily.     sertraline (ZOLOFT) 50 MG tablet Take 50 mg by mouth daily.     cholestyramine  (QUESTRAN ) 4 g packet Take 1 packet (4 g total) by mouth 2 (two) times daily. Once to two times daily (titrate) (Patient not taking: Reported on 04/16/2024) 60 each 12   No current facility-administered medications for this visit.     REVIEW OF SYSTEMS:    Review of Systems - Oncology  All other pertinent systems were reviewed with the patient and are negative.  PHYSICAL EXAMINATION:   Onc Performance Status - 04/16/24 1406       ECOG Perf Status   ECOG Perf Status Fully active, able to carry on all pre-disease performance without restriction      KPS SCALE   KPS % SCORE Normal, no compliants, no evidence of disease             Vitals:   04/16/24 1358  BP: (!) 135/97  Pulse: 70  Resp: 20  Temp: 97.7 F (36.5 C)  SpO2: 100%   Filed Weights   04/16/24 1358  Weight: 266 lb 11.2 oz (121 kg)    Physical Exam Constitutional:      General: She is not in  acute distress.    Appearance: Normal appearance.  HENT:     Head: Normocephalic and atraumatic.  Eyes:     General: No scleral icterus.    Conjunctiva/sclera: Conjunctivae normal.  Cardiovascular:     Rate and Rhythm: Normal rate and regular rhythm.     Heart sounds: Normal heart sounds.  Pulmonary:     Effort: Pulmonary effort is normal.     Breath sounds: Normal breath sounds.  Abdominal:     General: There is no distension.  Musculoskeletal:     Right lower leg: No edema.     Left lower leg: No edema.  Neurological:     General: No focal deficit present.     Mental Status: She is alert and oriented to person, place, and time.  Psychiatric:        Mood and Affect: Mood normal.         Behavior: Behavior normal.        Thought Content: Thought content normal.      LABORATORY DATA:   I have reviewed the data as listed.  Results for orders placed or performed in visit on 04/16/24  Iron and Iron Binding Capacity ([facility] only)  Result Value Ref Range   Iron 48 28 - 170 ug/dL   TIBC 191 478 - 295 ug/dL   Saturation Ratios 14 10.4 - 31.8 %   UIBC 305 148 - 442 ug/dL  APTT  Result Value Ref Range   aPTT 27 24 - 36 seconds  Protime-INR  Result Value Ref Range   Prothrombin Time 13.5 11.4 - 15.2 seconds   INR 1.0 0.8 - 1.2  D-dimer, quantitative  Result Value Ref Range   D-Dimer, Quant 0.28 0.00 - 0.50 ug/mL-FEU  CMP (Cancer Center only)  Result Value Ref Range   Sodium 139 135 - 145 mmol/L   Potassium 3.9 3.5 - 5.1 mmol/L   Chloride 103 98 - 111 mmol/L   CO2 30 22 - 32 mmol/L   Glucose, Bld 117 (H) 70 - 99 mg/dL   BUN 11 6 - 20 mg/dL   Creatinine 6.21 3.08 - 1.00 mg/dL   Calcium 9.5 8.9 - 65.7 mg/dL   Total Protein 7.6 6.5 - 8.1 g/dL   Albumin 4.5 3.5 - 5.0 g/dL   AST 16 15 - 41 U/L   ALT 19 0 - 44 U/L   Alkaline Phosphatase 78 38 - 126 U/L   Total Bilirubin 0.3 0.0 - 1.2 mg/dL   GFR, Estimated >84 >69 mL/min   Anion gap 6 5 - 15  CBC with Differential (Cancer Center Only)  Result Value Ref Range   WBC Count 7.7 4.0 - 10.5 K/uL   RBC 4.95 3.87 - 5.11 MIL/uL   Hemoglobin 13.3 12.0 - 15.0 g/dL   HCT 62.9 52.8 - 41.3 %   MCV 82.4 80.0 - 100.0 fL   MCH 26.9 26.0 - 34.0 pg   MCHC 32.6 30.0 - 36.0 g/dL   RDW 24.4 01.0 - 27.2 %   Platelet Count 308 150 - 400 K/uL   nRBC 0.0 0.0 - 0.2 %   Neutrophils Relative % 58 %   Neutro Abs 4.6 1.7 - 7.7 K/uL   Lymphocytes Relative 31 %   Lymphs Abs 2.4 0.7 - 4.0 K/uL   Monocytes Relative 7 %   Monocytes Absolute 0.6 0.1 - 1.0 K/uL   Eosinophils Relative 2 %   Eosinophils Absolute 0.2 0.0 - 0.5 K/uL   Basophils  Relative 1 %   Basophils Absolute 0.0 0.0 - 0.1 K/uL   Immature Granulocytes 1 %   Abs Immature  Granulocytes 0.04 0.00 - 0.07 K/uL     RADIOGRAPHIC STUDIES:   No recent pertinent imaging studies available to review.  Orders Placed This Encounter  Procedures   Iron and Iron Binding Capacity ([facility] only)    Standing Status:   Future    Number of Occurrences:   1    Expiration Date:   04/16/2025   Ferritin    Standing Status:   Future    Number of Occurrences:   1    Expiration Date:   04/16/2025     Future Appointments  Date Time Provider Department Center  04/24/2024  8:45 AM Taeden Geller, Gale Jude, MD [facility] None  04/17/2025  2:00 PM [facility] None  04/17/2025  2:30 PM Elfie Costanza, Gale Jude, MD [facility] None     This document was completed utilizing speech recognition software. Grammatical errors, random word insertions, pronoun errors, and incomplete sentences are an occasional consequence of this system due to software limitations, ambient noise, and hardware issues. Any formal questions or concerns about the content, text or information contained within the body of this dictation should be directly addressed to the provider for clarification.

## 2024-04-16 NOTE — Assessment & Plan Note (Signed)
 Factor V Leiden heterozygosity was identified through genetic testing due to a family history of DVTs, with her father experiencing multiple DVTs. She has no personal history of thrombosis. Her risk of clotting is increased six to eight-fold compared to the general population, but anticoagulation is not indicated without a documented clot.   She is scheduled for surgery on Apr 28, 2024, and requires prophylactic anticoagulation post-operatively due to increased thrombosis risk. Informed consent was obtained for Eliquis, preferred over Lovenox  for ease of administration, as it is a tablet taken twice daily.  - Order Factor V Leiden mutation test, prothrombin gene mutation test (as it can coexist) and D-dimer test to assess clotting risk.  D-dimer was low at 0.28 today.  - Prescribed Eliquis for prophylactic anticoagulation for four weeks post-surgery.  - Coordinate with Dr. Tomblin regarding timing of anticoagulation initiation post-surgery.  - Arrange for a follow-up phone call on Apr 24, 2024, to discuss test results and any necessary changes to the plan.

## 2024-04-16 NOTE — Assessment & Plan Note (Signed)
 Heavy menstrual bleeding is likely related to IUD use. She is scheduled for tubal ligation and ablation on Apr 28, 2024. - Check iron levels to assess for anemia due to heavy menstrual bleeding.

## 2024-04-17 LAB — FERRITIN: Ferritin: 58 ng/mL (ref 11–307)

## 2024-04-21 LAB — FACTOR 5 LEIDEN

## 2024-04-23 LAB — PROTHROMBIN GENE MUTATION

## 2024-04-24 ENCOUNTER — Encounter: Payer: Self-pay | Admitting: Oncology

## 2024-04-24 ENCOUNTER — Inpatient Hospital Stay: Attending: Oncology | Admitting: Oncology

## 2024-04-24 DIAGNOSIS — N920 Excessive and frequent menstruation with regular cycle: Secondary | ICD-10-CM

## 2024-04-24 DIAGNOSIS — D6851 Activated protein C resistance: Secondary | ICD-10-CM

## 2024-04-24 HISTORY — PX: OTHER SURGICAL HISTORY: SHX169

## 2024-04-24 HISTORY — PX: TUBAL LIGATION: SHX77

## 2024-04-24 NOTE — Progress Notes (Signed)
 [facility]  HEMATOLOGY-ONCOLOGY ELECTRONIC VISIT PROGRESS NOTE  PATIENT NAME: Bianca Lee   MR#: [id number] DOB: 1976-11-08  DATE OF SERVICE: 04/24/2024  Patient Care Team: Bianca Speed, MD as PCP - General (Family Medicine) Bianca Flint, MD as Consulting Physician (Obstetrics and Gynecology)  I connected with the patient via telephone conference and verified that I am speaking with the correct person using two identifiers. The patient's location is at home and I am providing care from the [facility].  I discussed the limitations, risks, security and privacy concerns of performing an evaluation and management service by e-visits and the availability of in person appointments. I also discussed with the patient that there may be a patient responsible charge related to this service. The patient expressed understanding and agreed to proceed.   ASSESSMENT & PLAN:   Bianca Lee is a 48 y.o. lady with history of factor V Leiden mutation heterozygosity, no prior thromboembolism, asthma, irritable bowel syndrome was referred to us  for recommendations regarding postoperative anticoagulation.   Heterozygous factor V Leiden mutation (HCC) Factor V Leiden heterozygosity was identified through genetic testing due to a family history of DVTs, with her father experiencing multiple DVTs. She has no personal history of thrombosis. Her risk of clotting is increased six to eight-fold compared to the general population, but anticoagulation is not indicated without a documented clot.   She is scheduled for surgery on Apr 28, 2024, and requires prophylactic anticoagulation post-operatively due to increased thrombosis risk. Informed consent was obtained for Eliquis , preferred over Lovenox  for ease of administration, as it is a tablet taken twice daily.  - On her initial consultation with us  on 04/16/2024, we checked factor V Leiden mutation and it did confirm heterozygosity,  suggesting 6-8 fold increased risk of venous thromboembolism.  Prothrombin gene mutation was negative.  D-dimer was low at 0.28.  PT, PTT, CBCD and CMP were entirely unremarkable.  Iron studies were within normal limits.  - Prescribed Eliquis  for prophylactic anticoagulation for four weeks post-surgery.  - Coordinate with Dr. Tomblin regarding timing of anticoagulation initiation post-surgery.  Patient did not pick up the prescription for Eliquis .  She is planned to start 12 hours post surgery and continue it for 1 month.   RTC in 1 year for routine follow-up.  Menorrhagia with regular cycle Heavy menstrual bleeding is likely related to IUD use. She is scheduled for tubal ligation and ablation on Apr 28, 2024.  We checked iron levels and CBCD on 04/16/2024 and they were unremarkable.  No iron deficiency.   I discussed the assessment and treatment plan with the patient. The patient was provided an opportunity to ask questions and all were answered. The patient agreed with the plan and demonstrated an understanding of the instructions. The patient was advised to call back or seek an in-person evaluation if the symptoms worsen or if the condition fails to improve as anticipated.    I spent 11 minutes over the phone with the patient reviewing test results, discuss management and coordination/planning of care.  Bianca Berber, MD 04/24/2024 8:59 AM [facility] - A DEPT OF [facility] [address] [place] [address] Dept: [phone] Dept Fax: [phone]   INTERVAL HISTORY:  Please see above for problem oriented charting.  The purpose of today's discussion is to explain recent lab results and to formulate plan of care.  Discussed the use of AI scribe  software for clinical note transcription with the patient, who gave verbal consent to proceed.  History of Present Illness Bianca Lee "Burdette Carolin" is a 48 year old female  with factor V Leiden heterozygosity who presents for follow-up on recent blood work results.  She has a known history of factor V Leiden heterozygosity, which significantly increases her risk of venous thromboembolism. Recent blood work was conducted to monitor her condition, revealing normal levels of white blood cells, red blood cells, platelets, iron, kidney and liver function, electrolytes, and D-dimer. The prothrombin gene mutation test was negative.    SUMMARY OF HEMATOLOGY HISTORY:  She has a known history of factor V Leiden heterozygosity, identified several years ago after her father, who has had multiple deep vein thromboses (DVTs) in his legs and lungs, was diagnosed. Following her father's diagnosis, she and her sister were tested and found to be heterozygous for the mutation. During her pregnancy in 2015, she was prescribed Lovenox  postpartum as a precautionary measure.   No current symptoms suggestive of a clot, such as leg swelling, pain, or redness.  Factor V Leiden heterozygosity was identified through genetic testing due to a family history of DVTs, with her father experiencing multiple DVTs. She has no personal history of thrombosis. Her risk of clotting is increased six to eight-fold compared to the general population, but anticoagulation is not indicated without a documented clot.    She is scheduled for surgery on Apr 28, 2024, and requires prophylactic anticoagulation post-operatively due to increased thrombosis risk. Informed consent was obtained for Eliquis , preferred over Lovenox  for ease of administration, as it is a tablet taken twice daily.   - Order Factor V Leiden mutation test, prothrombin gene mutation test (as it can coexist) and D-dimer test to assess clotting risk.  D-dimer was low at 0.28 today.   - Prescribed Eliquis  for prophylactic anticoagulation for four weeks post-surgery.   - Coordinate with Dr. Tomblin regarding timing of anticoagulation initiation  post-surgery   REVIEW OF SYSTEMS:    Review of Systems - Oncology  All other pertinent systems were reviewed with the patient and are negative.  I have reviewed the past medical history, past surgical history, social history and family history with the patient and they are unchanged from previous note.  ALLERGIES:  She is allergic to morphine  and codeine.  MEDICATIONS:  Current Outpatient Medications  Medication Sig Dispense Refill   apixaban  (ELIQUIS ) 5 MG TABS tablet Take 1 tablet (5 mg total) by mouth 2 (two) times daily. 60 tablet 0   cholestyramine  (QUESTRAN ) 4 g packet Take 1 packet (4 g total) by mouth 2 (two) times daily. Once to two times daily (titrate) (Patient not taking: Reported on 04/16/2024) 60 each 12   diphenhydrAMINE  (BENADRYL ) 25 MG tablet Take 25 mg by mouth every evening.     naproxen sodium (ALEVE) 220 MG tablet Take 220 mg by mouth.     omeprazole (PRILOSEC) 20 MG capsule Take 20 mg by mouth daily.     sertraline (ZOLOFT) 50 MG tablet Take 50 mg by mouth daily.     No current facility-administered medications for this visit.    PHYSICAL EXAMINATION:    Onc Performance Status - 04/24/24 0800       ECOG Perf Status   ECOG Perf Status Fully active, able to carry on all pre-disease performance without restriction      KPS SCALE   KPS % SCORE Normal, no compliants, no evidence of disease  LABORATORY DATA:   I have reviewed the data as listed.  Recent Results (from the past 2160 hours)  Ferritin     Status: None   Collection Time: 04/16/24  2:49 PM  Result Value Ref Range   Ferritin 58 11 - 307 ng/mL    Comment: Performed at [facility], [address], [place], [place] [address]  Iron and Iron Binding Capacity ([facility] only)     Status: None   Collection Time: 04/16/24  2:50 PM  Result Value Ref Range   Iron 48 28 - 170 ug/dL   TIBC 604 540 - 981 ug/dL   Saturation Ratios 14 10.4 - 31.8 %   UIBC 305 148 -  442 ug/dL    Comment: Performed at [facility], [address], [place], [place] [address]  APTT     Status: None   Collection Time: 04/16/24  2:50 PM  Result Value Ref Range   aPTT 27 24 - 36 seconds    Comment: Performed at [facility], [address], [place], [place] [address]  Protime-INR     Status: None   Collection Time: 04/16/24  2:50 PM  Result Value Ref Range   Prothrombin Time 13.5 11.4 - 15.2 seconds   INR 1.0 0.8 - 1.2    Comment: (NOTE) INR goal varies based on device and disease states. Performed at [facility], [address], [place], [place] [address]   Prothrombin gene mutation     Status: None   Collection Time: 04/16/24  2:50 PM  Result Value Ref Range   Recommendations-PTGENE: Comment     Comment: (NOTE) Result: c.*97G>A - Not Detected This result is not associated with an increased risk for venous thromboembolism. See Additional Clinical Information and Comments. Additional Clinical Information: Venous thromboembolism is a multifactorial disease influenced by genetic, environmental, and circumstantial risk factors. The c.*97G>A variant in the F2 gene is a genetic risk factor for venous thromboembolism. Heterozygous carriers have a 2- to 4-fold increased risk for venous thromboembolism. Homozygotes for the c.*97G>A variant are rare. The annual risk of VTE in homozygotes has been reported to be 1.1%/year. Individuals who carry both a c.*97G>A variant in the F2 gene and a c.1601G>A (p. Arg534Gln) variant in the F5 gene (commonly referred to as Factor V Leiden) have an approximately 20- fold increased risk for venous thromboembolism. Risks are likely to be even higher in more complex genotype combinations involving the F2 c.*97G>A variant and Factor V Leiden (PMID:  65784696). Additional risk factors include but are not limited to: deficiency of protein C, protein S, or antithrombin III, age,  female sex, personal or family history of deep vein thromboembolism, smoking, surgery, prolonged immobilization, malignant neoplasm, tamoxifen treatment, raloxifene treatment, oral contraceptive use, hormone replacement therapy, and pregnancy. Management of thrombotic risk and thrombotic events should follow established guidelines and fit the clinical circumstance. This result cannot predict the occurrence or recurrence of a thrombotic event. Comments: Genetic counseling is recommended to discuss the potential clinical implications of positive results, as well as recommendations for testing family members. Genetic Coordinators are available for health care providers to discuss results at [phone]). Test Details: Variant analyzed: c.*97G>A, previously referred to as G20210A Methods/Limitations: DNA analysis of the F2 gene (NM_000 506.5) was performed by PCR amplification followed by restriction enzyme analysis. The diagnostic sensitivity is >99%. Results must be combined with clinical information for the most accurate interpretation. Molecular-based testing is highly accurate, but  as in any laboratory test, diagnostic errors may occur. False positive or false negative results may occur for reasons that include genetic variants, blood transfusions, bone marrow transplantation, somatic or tissue-specific mosaicism, mislabeled samples, or erroneous representation of family relationships. This test was developed and its performance characteristics determined by [facility]. It has not been cleared or approved by the Food and Drug Administration. References: Bhatt S, Taylor AK, Lozano R, Grody [facility], Manfred [facility]; ACMG Professional Practice and Guidelines Committee. Addendum: [employer] of [employer] Genetics consensus statement on factor V Leiden mutation testing. Genet Med. 2021 Mar 5. doi: 16.1096/E454 [phone]. PMID: 09811914. Nanetta Baars. Prothrombin Thrombophilia. 2006  Jul 25 [Updated 2021 Feb 4]. In: Jerri Morale, Ardinger HH, Pagon RA, et al., editors. GeneReviews(R) [Internet]. [address] ([place]): [employer] , [place]; 7829-5621. Available from: [email or web address] Lynda Sands, Huang X, Luo B, Spector EB, Nicolasa Barrett CS; ACMG Laboratory Quality Assurance Committee. Venous thromboembolism laboratory testing (factor V Leiden and factor II c.*97G>A), 2018 update: a technical standard of the [employer] of [employer] and [employer]omics (ACMG). Genet Med. 2018 Dec;20(12):1489-1498. doi: 10.1038/[id number]. Epub 2018 Oct 5. PMID: 30865784.    Reviewed by: Comment     Comment: (NOTE) Technical Component performed at [facility] RT[facility] Professional Component performed by: Horald Lyme, PhD, [facility] [id number], [facility], [address] [place] [address] Performed At: [facility]P [address] [place], [place] [id number] Adams Adams MDPhD [id number]   Factor 5 leiden     Status: Abnormal   Collection Time: 04/16/24  2:50 PM  Result Value Ref Range   Recommendations-F5LEID: Comment (A)     Comment: (NOTE) Result: c.1601G>A (p.Arg534Gln) - Detected, Heterozygous This result is associated with a 6- to 8-fold increased risk for venous thromboembolism. See Additional Clinical Information and Comments. Additional Clinical Information: Venous thromboembolism is a multifactorial disease influenced by genetic, environmental, and circumstantial risk factors. The c.1601G>A (p. Arg534Gln) variant in the F5 gene, commonly referred to as Factor V Leiden, is a genetic risk factor for venous thromboembolism. Heterozygous carriers of this variant have a 6- to 8- fold increased risk for venous thromboembolism. Individuals homozygous for this variant (ie, with a copy of the variant on each chromosome) have an approximately 80-fold increased risk for venous thromboembolism. Individuals who carry both a c.*97G>A variant in  the F2 gene and Factor V Leiden have an approximately 20-fold increased risk for venous thromboembolism. Risks are likely to be even higher in more complex genoty pe combinations involving the F2 c.*97G>A variant and Factor V Leiden (PMID: 03474259). Additional risk factors include but are not limited to: deficiency of protein C, protein S, or antithrombin III, age, female sex, personal or family history of deep vein thromboembolism, smoking, surgery, prolonged immobilization, malignant neoplasm, tamoxifen treatment, raloxifene treatment, oral contraceptive use, hormone replacement therapy, and pregnancy. Management of thrombotic risk and thrombotic events should follow established guidelines and fit the clinical circumstance. This result cannot predict the occurrence or recurrence of a thrombotic event. Comment: Genetic counseling is recommended to discuss the potential clinical implications of positive results, as well as recommendations for testing family members. Genetic Coordinators are available for health care providers to discuss results at [phone]). Test Details: Variant Analyzed: c.1601G>A (p. Arg534Gln), r eferred to as Factor V Leiden Methods/Limitations: DNA analysis of the F5 gene (NM_000130.5) was performed by PCR amplification followed by restriction enzyme analysis. The diagnostic sensitivity is >99%. Results must be combined with clinical information for the most accurate interpretation. Molecular-  based testing is highly accurate, but as in any laboratory test, diagnostic errors may occur. False positive or false negative results may occur for reasons that include genetic variants, blood transfusions, bone marrow transplantation, somatic or tissue-specific mosaicism, mislabeled samples, or erroneous representation of family relationships. This test was developed and its performance characteristics determined by [facility]. It has not been cleared or  approved by the Food and Drug Administration. References: Bhatt S, Taylor AK, Lozano R, Grody [facility], Manfred Seed [facility]; ACMG Professional Practice and Guidelines Committee. Addendum: [employer] of Medical Genetics consensu s statement on factor V Leiden mutation testing. Genet Med. 2021 Mar 5. doi: 91.4782/N56213-086- 01108-x. PMID: 57846962. Nanetta Baars. Factor V Leiden Thrombophilia. 1999 May 14 (Updated 2018 Jan 4). In: Jerri Morale, Ardinger HH, Pagon RA, et al., editors. GeneReviews(R) (Internet). [address] [facility]): [employer] , [place]; 9528-4132. Available from: [email or web address] Lynda Sands, Huang X, Luo B, Spector EB, Nicolasa Barrett CS; ACMG Laboratory Quality Assurance Committee. Venous thromboembolism laboratory testing (factor V Leiden and factor II c.*97G>A), 2018 update: a technical standard of the [employer] of [employer] and Gen[employer] (ACMG). Genet Med. 2018 Dec;20(12):1489-1498. doi: 10.1038/[phone]. Epub 2018 Oct 5. PMID: 44010272.    Reviewed By: Comment     Comment: (NOTE) Technical Component performed at [facility] Professional Component performed by: Gwyneth Lesch, PhD, [facility] [id number], [facility], [address] [place] [address] Performed At: [facility] RTP [address] [place], [place] [id number] Adams Adams MDPhD [id number]   D-dimer, quantitative     Status: None   Collection Time: 04/16/24  2:50 PM  Result Value Ref Range   D-Dimer, Quant 0.28 0.00 - 0.50 ug/mL-FEU    Comment: (NOTE) At the manufacturer cut-off value of 0.5 g/mL FEU, this assay has a negative predictive value of 95-100%.This assay is intended for use in conjunction with a clinical pretest probability (PTP) assessment model to exclude pulmonary embolism (PE) and deep venous thrombosis (DVT) in outpatients suspected of PE or DVT. Results should be correlated with clinical presentation. Performed at [facility], [address], [place], [place] [address]   CMP ([facility] only)     Status: Abnormal   Collection Time: 04/16/24  2:50 PM  Result Value Ref Range   Sodium 139 135 - 145 mmol/L   Potassium 3.9 3.5 - 5.1 mmol/L   Chloride 103 98 - 111 mmol/L   CO2 30 22 - 32 mmol/L   Glucose, Bld 117 (H) 70 - 99 mg/dL    Comment: Glucose reference range applies only to samples taken after fasting for at least 8 hours.   BUN 11 6 - 20 mg/dL   Creatinine 1.66 0.63 - 1.00 mg/dL   Calcium 9.5 8.9 - 01.6 mg/dL   Total Protein 7.6 6.5 - 8.1 g/dL   Albumin 4.5 3.5 - 5.0 g/dL   AST 16 15 - 41 U/L   ALT 19 0 - 44 U/L   Alkaline Phosphatase 78 38 - 126 U/L   Total Bilirubin 0.3 0.0 - 1.2 mg/dL   GFR, Estimated >01 >09 mL/min    Comment: (NOTE) Calculated using the CKD-EPI Creatinine Equation (2021)    Anion gap 6 5 - 15    Comment: Performed at [facility], [address], [place], [place] [address]  CBC with Differential ([facility] Only)     Status: None   Collection Time: 04/16/24  2:50 PM  Result Value Ref Range  WBC Count 7.7 4.0 - 10.5 K/uL   RBC 4.95 3.87 - 5.11 MIL/uL   Hemoglobin 13.3 12.0 - 15.0 g/dL   HCT 16.1 09.6 - 04.5 %   MCV 82.4 80.0 - 100.0 fL   MCH 26.9 26.0 - 34.0 pg   MCHC 32.6 30.0 - 36.0 g/dL   RDW 40.9 81.1 - 91.4 %   Platelet Count 308 150 - 400 K/uL   nRBC 0.0 0.0 - 0.2 %   Neutrophils Relative % 58 %   Neutro Abs 4.6 1.7 - 7.7 K/uL   Lymphocytes Relative 31 %   Lymphs Abs 2.4 0.7 - 4.0 K/uL   Monocytes Relative 7 %   Monocytes Absolute 0.6 0.1 - 1.0 K/uL   Eosinophils Relative 2 %   Eosinophils Absolute 0.2 0.0 - 0.5 K/uL   Basophils Relative 1 %   Basophils Absolute 0.0 0.0 - 0.1 K/uL   Immature Granulocytes 1 %   Abs Immature Granulocytes 0.04 0.00 - 0.07 K/uL    Comment: Performed at [facility], [address], [place], [place] [address]     RADIOGRAPHIC STUDIES: No recent pertinent  imaging studies available to review.  Orders Placed This Encounter  Procedures   CBC with Differential ([facility] Only)    Standing Status:   Future    Expected Date:   04/17/2025    Expiration Date:   04/24/2025   D-dimer, quantitative    Standing Status:   Future    Expected Date:   04/17/2025    Expiration Date:   04/24/2025     Future Appointments  Date Time Provider Department [facility]  04/17/2025  2:00 PM [facility] None  04/17/2025  2:30 PM Norely Schlick, Gale Jude, MD [facility] None    This document was completed utilizing speech recognition software. Grammatical errors, random word insertions, pronoun errors, and incomplete sentences are an occasional consequence of this system due to software limitations, ambient noise, and hardware issues. Any formal questions or concerns about the content, text or information contained within the body of this dictation should be directly addressed to the provider for clarification.

## 2024-04-24 NOTE — Assessment & Plan Note (Addendum)
 Factor V Leiden heterozygosity was identified through genetic testing due to a family history of DVTs, with her father experiencing multiple DVTs. She has no personal history of thrombosis. Her risk of clotting is increased six to eight-fold compared to the general population, but anticoagulation is not indicated without a documented clot.   She is scheduled for surgery on Apr 28, 2024, and requires prophylactic anticoagulation post-operatively due to increased thrombosis risk. Informed consent was obtained for Eliquis , preferred over Lovenox  for ease of administration, as it is a tablet taken twice daily.  - On her initial consultation with us  on 04/16/2024, we checked factor V Leiden mutation and it did confirm heterozygosity, suggesting 6-8 fold increased risk of venous thromboembolism.  Prothrombin gene mutation was negative.  D-dimer was low at 0.28.  PT, PTT, CBCD and CMP were entirely unremarkable.  Iron studies were within normal limits.  - Prescribed Eliquis  for prophylactic anticoagulation for four weeks post-surgery.  - Coordinate with Dr. Tomblin regarding timing of anticoagulation initiation post-surgery.  Patient did not pick up the prescription for Eliquis .  She is planned to start 12 hours post surgery and continue it for 1 month.   RTC in 1 year for routine follow-up.

## 2024-04-24 NOTE — Assessment & Plan Note (Signed)
 Heavy menstrual bleeding is likely related to IUD use. She is scheduled for tubal ligation and ablation on Apr 28, 2024.  We checked iron levels and CBCD on 04/16/2024 and they were unremarkable.  No iron deficiency.

## 2024-04-28 ENCOUNTER — Other Ambulatory Visit: Payer: Self-pay | Admitting: Obstetrics and Gynecology

## 2024-04-29 LAB — SURGICAL PATHOLOGY

## 2024-11-17 ENCOUNTER — Encounter: Payer: Self-pay | Admitting: Physician Assistant

## 2024-11-17 ENCOUNTER — Ambulatory Visit: Admitting: Physician Assistant

## 2024-11-17 ENCOUNTER — Ambulatory Visit: Payer: Self-pay | Admitting: *Deleted

## 2024-11-17 VITALS — BP 133/89 | HR 78 | Temp 98.4°F | Resp 16 | Ht 69.0 in | Wt 276.5 lb

## 2024-11-17 DIAGNOSIS — J018 Other acute sinusitis: Secondary | ICD-10-CM

## 2024-11-17 DIAGNOSIS — J3089 Other allergic rhinitis: Secondary | ICD-10-CM

## 2024-11-17 DIAGNOSIS — Z8709 Personal history of other diseases of the respiratory system: Secondary | ICD-10-CM

## 2024-11-17 MED ORDER — AZITHROMYCIN 250 MG PO TABS: ORAL_TABLET | ORAL | 0 refills | Status: AC

## 2024-11-17 MED ORDER — ALBUTEROL SULFATE (2.5 MG/3ML) 0.083% IN NEBU: 2.5000 mg | INHALATION_SOLUTION | Freq: Four times a day (QID) | RESPIRATORY_TRACT | 1 refills | Status: DC | PRN

## 2024-11-17 MED ORDER — PREDNISONE 20 MG PO TABS: 20.0000 mg | ORAL_TABLET | Freq: Two times a day (BID) | ORAL | 0 refills | Status: DC

## 2024-11-17 NOTE — Telephone Encounter (Signed)
 FYI Only or Action Required?: FYI only for provider: appointment scheduled on 12/1.  Patient was last seen in primary care on 06/25/2023 by Bianca Nancyann BRAVO, MD.  Called Nurse Triage reporting Cough.  Symptoms began several weeks ago.  Interventions attempted: OTC medications: Mucinex  DM, Robitussin cough, tessalon  pearls.  Symptoms are: gradually worsening.  Triage Disposition: See Physician Within 24 Hours  Patient/caregiver understands and will follow disposition?: Yes  Copied from CRM #[id number]. Topic: Clinical - Red Word Triage >> Nov 17, 2024  8:51 AM Emylou G wrote: [facility] that prompted transfer to Nurse Triage: cough, tired, drainage in chest - congestion. persistant cough.. >> Nov 17, 2024  8:59 AM Emylou G wrote: ** adding her ears are hurting ** Reason for Disposition  [1] Continuous (nonstop) coughing interferes with work or school AND [2] no improvement using cough treatment per Care Advice  Answer Assessment - Initial Assessment Questions 1. ONSET: When did the cough begin?      2 weeks 2. SEVERITY: How bad is the cough today?      Non stop 3. SPUTUM: Describe the color of your sputum (e.g., none, dry cough; clear, white, yellow, green)     Only moving in am- yellowish 4. HEMOPTYSIS: Are you coughing up any blood? If Yes, ask: How much? (e.g., flecks, streaks, tablespoons, etc.)     no 5. DIFFICULTY BREATHING: Are you having difficulty breathing? If Yes, ask: How bad is it? (e.g., mild, moderate, severe)      Sometimes during coughing spell 6. FEVER: Do you have a fever? If Yes, ask: What is your temperature, how was it measured, and when did it start?     Low grade temp at beginning- 99.5, now hot spells  7. CARDIAC HISTORY: Do you have any history of heart disease? (e.g., heart attack, congestive heart failure)      no 8. LUNG HISTORY: Do you have any history of lung disease?  (e.g., pulmonary embolus, asthma, emphysema)     no 9. PE RISK  FACTORS: Do you have a history of blood clots? (or: recent major surgery, recent prolonged travel, bedridden)     Blood disorder 10. OTHER SYMPTOMS: Do you have any other symptoms? (e.g., runny nose, wheezing, chest pain)       Ear pain  Protocols used: Cough - Acute Non-Productive-A-AH

## 2024-11-17 NOTE — Progress Notes (Signed)
 Established patient visit  Patient: Bianca Lee   DOB: 1976-02-07   48 y.o. Female  MRN: [id number] Visit Date: 11/17/2024  Today's healthcare provider: Jolynn Spencer, PA-C   Chief Complaint  Patient presents with   Cough    Persistent cough, tired, drainage in chest congestion, bilateral ear pain ongoing 2 weeks otc: mucinex  DM, Delsym, Robitussin, tessalon  pearls, vapor rub.    Subjective      Discussed the use of AI scribe software for clinical note transcription with the patient, who gave verbal consent to proceed.  History of Present Illness Bianca Lee is a 48 year old female who presents with persistent cough and congestion.  Her symptoms began about two weeks ago after exposure to sick contacts, starting with head congestion, headache, and fatigue. She improved slightly after a week, then worsened with a persistent cough and chest congestion.  The cough is frequent and disruptive to work and school. It is associated with intermittent shortness of breath, chest clearing, ear pain from coughing, and back soreness.  She has had low-grade fevers up to 99.92F and now has hot spells. She was diagnosed with exercise-induced asthma in high school.  She has used Mucinex  DM, Revitacin CF, and Tessalon  Perles without relief. She reports prior sinus infections and sensitivities to dust and ragweed.  She has morning nasal drainage that improves after a shower and movement. She no longer has a sore throat. She reports wheezing at night when coughing.       06/25/2023    3:10 PM 02/05/2019    4:00 PM 10/02/2018    3:15 PM  Depression screen PHQ 2/9  Decreased Interest 0 0 0  Down, Depressed, Hopeless 0 0 0  PHQ - 2 Score 0 0 0  Altered sleeping 0 0 1  Tired, decreased energy 3 0 1  Change in appetite 0 0 1  Feeling bad or failure about yourself  0 2 0  Trouble concentrating 0 0 0  Moving slowly or fidgety/restless 0 0 0  Suicidal thoughts 0 0 0  PHQ-9 Score 3  2   3    Difficult doing work/chores Not difficult at all Not difficult at all Not difficult at all     Data saved with a previous flowsheet row definition       No data to display          Medications: Outpatient Medications Prior to Visit  Medication Sig   diphenhydrAMINE  (BENADRYL ) 25 MG tablet Take 25 mg by mouth every evening.   sertraline (ZOLOFT) 50 MG tablet Take 50 mg by mouth daily.   [DISCONTINUED] apixaban  (ELIQUIS ) 5 MG TABS tablet Take 1 tablet (5 mg total) by mouth 2 (two) times daily.   [DISCONTINUED] cholestyramine  (QUESTRAN ) 4 g packet Take 1 packet (4 g total) by mouth 2 (two) times daily. Once to two times daily (titrate) (Patient not taking: Reported on 04/16/2024)   [DISCONTINUED] naproxen sodium (ALEVE) 220 MG tablet Take 220 mg by mouth.   [DISCONTINUED] omeprazole (PRILOSEC) 20 MG capsule Take 20 mg by mouth daily.   No facility-administered medications prior to visit.    Review of Systems  All other systems reviewed and are negative.  All negative Except see HPI       Objective    BP 133/89   Pulse 78   Temp 98.4 F (36.9 C) (Oral)   Resp 16   Ht 5' 9 (1.753 m)   Wt 276 lb 8 oz (125.4  kg)   SpO2 100%   BMI 40.83 kg/m     Physical Exam Vitals reviewed.  Constitutional:      Appearance: She is normal weight.  HENT:     Head: Normocephalic and atraumatic.     Right Ear: Ear canal and external ear normal.     Left Ear: Ear canal and external ear normal.     Nose: Congestion and rhinorrhea present.     Mouth/Throat:     Pharynx: Posterior oropharyngeal erythema present.     Comments: Postnasal drainage noted Eyes:     General: No scleral icterus.       Right eye: No discharge.        Left eye: No discharge.     Extraocular Movements: Extraocular movements intact.     Pupils: Pupils are equal, round, and reactive to light.  Cardiovascular:     Rate and Rhythm: Normal rate and regular rhythm.  Pulmonary:     Effort: Pulmonary effort  is normal.     Breath sounds: Normal breath sounds.  Abdominal:     General: Abdomen is flat. Bowel sounds are normal.     Palpations: Abdomen is soft.  Lymphadenopathy:     Cervical: No cervical adenopathy.  Neurological:     Mental Status: She is alert.      No results found for any visits on 11/17/24.      Assessment & Plan Acute sinusitis vs upper respiratory infection with cough Persistent cough, chest congestion, and ear pain with low-grade fever. Possible bacterial sinusitis considering double worsening and duration of 14 days. - Prescribed azithromycin (Z-Pak). - Prescribed prednisone  20 mg twice daily for five days. - Prescribed albuterol  inhaler every four to six hours as needed for cough, sob, wheezing. - Recommended nasal saline spray and Flonase. - Advised increased fluid intake and rest. - Suggested wearing a mask to reduce allergen exposure. Rtc if symptoms worsen or persist  History of asthma Triggers: strong smell and cold/winter Rx albuterol   Allergic Rhinitis: - Avoidance measures discussed. - Use nasal saline rinses before nose sprays such as with Neilmed Sinus Rinse bottle.  Use distilled water.  Or nasal saline spray - Use Flonase 2 sprays each nostril daily. Aim upward and outward. - Use Zyrtec 10 mg daily.   No orders of the defined types were placed in this encounter.   No follow-ups on file.   The patient was advised to call back or seek an in-person evaluation if the symptoms worsen or if the condition fails to improve as anticipated.  I discussed the assessment and treatment plan with the patient. The patient was provided an opportunity to ask questions and all were answered. The patient agreed with the plan and demonstrated an understanding of the instructions.  I, Tonnia Bardin, PA-C have reviewed all documentation for this visit. The documentation on 11/17/2024  for the exam, diagnosis, procedures, and orders are all accurate and  complete.  Jolynn Spencer, [facility], MMS [facility] [phone] (phone) [phone] (fax)  [facility]

## 2024-11-18 ENCOUNTER — Telehealth: Payer: Self-pay

## 2024-11-18 DIAGNOSIS — R0602 Shortness of breath: Secondary | ICD-10-CM

## 2024-11-18 DIAGNOSIS — J018 Other acute sinusitis: Secondary | ICD-10-CM

## 2024-11-18 DIAGNOSIS — R062 Wheezing: Secondary | ICD-10-CM

## 2024-11-18 DIAGNOSIS — R051 Acute cough: Secondary | ICD-10-CM

## 2024-11-18 MED ORDER — ALBUTEROL SULFATE HFA 108 (90 BASE) MCG/ACT IN AERS: 2.0000 | INHALATION_SPRAY | Freq: Four times a day (QID) | RESPIRATORY_TRACT | 2 refills | Status: AC | PRN

## 2024-11-18 NOTE — Telephone Encounter (Signed)
 Looks like she seen Janna yesterday for this.    Copied from CRM #[id number]. Topic: Clinical - Prescription Issue >> Nov 18, 2024  8:14 AM Bianca Lee wrote: Reason for CRM: Patient called in stating the nurse was suppose to call in inhaler but instead called in something that goes into a nebulizer and never sent anything in for cough. Would like to speak with nurse.

## 2024-11-28 ENCOUNTER — Encounter: Payer: Self-pay | Admitting: Family Medicine

## 2024-12-01 ENCOUNTER — Ambulatory Visit: Payer: Self-pay

## 2024-12-01 NOTE — Telephone Encounter (Signed)
 FYI Only or Action Required?: FYI only for provider: appointment scheduled on 12/16.  Patient was last seen in primary care on 11/17/2024 by Ostwalt, Janna, PA-C.  Called Nurse Triage reporting Back Pain.  Symptoms began a week ago.  Interventions attempted: Prescription medications: zpack, prednisone .  Symptoms are: gradually improving.  Triage Disposition: See HCP Within 4 Hours (Or PCP Triage)  Patient/caregiver understands and will follow disposition?: Yes  Copied from CRM #[id number]. Topic: Clinical - Red Word Triage >> Dec 01, 2024  1:12 PM Charlet HERO wrote: Red Word that prompted transfer to Nurse Triage: Patient is calling about upper back pain she is stating that she had upper resp ans saw Benedict on last week was given antibiotics she finished them, she is taking 4 ibuprofen  but not doing anything, slight wheezing and nasal congestion. Dr Myrla Reason for Disposition  [1] SEVERE back pain (e.g., excruciating, unable to do any normal activities) AND [2] not improved 2 hours after pain medicine  Answer Assessment - Initial Assessment Questions Left upper back pain- has now spread to the right side 800mg  ibuprofen - sometimes it helps. Heating pack Wakes her, hard to get up in the morning.  Sinusitis.- Jana- zpack, prednisone   ABX felt like she was getting worse-  Coughing- made it worse.  Denies the same wheezing that she had.   Was given a z-pack, prednisone , no chest XR. Left lung didn't sound good. Concerned she may have bruised her rib in the back from coughing or pneumonia. Feels someone better but the back pain is making it hard to get through her day to day.   Appt with pcp office 12/16 to assess. ED precautions advised   1. ONSET: When did the pain begin? (e.g., minutes, hours, days)     3 weeks of illness- back pain for about 10-11day 2. LOCATION: Where does it hurt? (upper, mid or lower back)     Started in the upper left back under shoulder blades and spread  to right upper back under her shoulder blade 3. SEVERITY: How bad is the pain?  (e.g., Scale 1-10; mild, moderate, or severe)     8/10-- meds it brings it to about 6-7/10 4. PATTERN: Is the pain constant? (e.g., yes, no; constant, intermittent)      Worse with coughing,  5. RADIATION: Does the pain shoot into your legs or somewhere else?     denies 6. CAUSE:  What do you think is causing the back pain?      Coughing/URI 7. BACK OVERUSE:  Any recent lifting of heavy objects, strenuous work or exercise?     Coughing a lot with URI 8. MEDICINES: What have you taken so far for the pain? (e.g., nothing, acetaminophen , NSAIDS)     Prednisone   9. NEUROLOGIC SYMPTOMS: Do you have any weakness, numbness, or problems with bowel/bladder control?     denies 10. OTHER SYMPTOMS: Do you have any other symptoms? (e.g., fever, abdomen pain, burning with urination, blood in urine)       Cough  Protocols used: Back Pain-A-AH

## 2024-12-01 NOTE — Telephone Encounter (Signed)
 Will send to provider that saw her for acute concern for follow up  Also CMAs, I haven't seen her in several years.  I recommend that she make a CPE appt to re-establish with PCP and also focus on wellness at some point in the next few months.

## 2024-12-02 ENCOUNTER — Encounter: Payer: Self-pay | Admitting: Family Medicine

## 2024-12-02 ENCOUNTER — Ambulatory Visit: Admitting: Family Medicine

## 2024-12-02 VITALS — BP 123/90 | HR 85 | Temp 98.1°F | Ht 69.0 in | Wt 280.4 lb

## 2024-12-02 DIAGNOSIS — M549 Dorsalgia, unspecified: Secondary | ICD-10-CM | POA: Diagnosis not present

## 2024-12-02 MED ORDER — CELECOXIB 200 MG PO CAPS: 200.0000 mg | ORAL_CAPSULE | Freq: Two times a day (BID) | ORAL | 0 refills | Status: AC

## 2024-12-02 MED ORDER — CYCLOBENZAPRINE HCL 5 MG PO TABS: 5.0000 mg | ORAL_TABLET | Freq: Every evening | ORAL | 1 refills | Status: AC | PRN

## 2024-12-02 NOTE — Progress Notes (Signed)
 "     Established patient visit   Patient: Bianca Lee   DOB: 16-Apr-1976   48 y.o. Female  MRN: [id number] Visit Date: 12/02/2024  Today's healthcare provider: LAURAINE LOISE BUOY, DO   Chief Complaint  Patient presents with   Acute Visit    Patient is here back pain on upper left side and now on the right side feels like it is up high however starting to feel like it is getting lower on the right side.  States that the back pain started around Dec 5th.  States that was sick the week prior to Thanksgiving and came in seen another provider that has got better however she still has a cough.   Subjective    HPI Bianca Lee is a 48 year old female who presents with persistent back pain.  She has been experiencing worsening back pain for approximately four weeks. Initially, the pain was localized to the left upper side and was sensitive to touch. Over the last 72 hours, it has spread to the right side and feels like it might radiate downwards. The pain is described as a deep, dull ache, unlike the sharp pain she experienced with a previous rib fracture. It is severe enough to cause her to 'yell out' at times.  She has a history of an upper respiratory infection before Thanksgiving, which improved after treatment with a Z-Pak and steroids. However, the cough persisted, though it has since improved. The back pain does not worsen with coughing, which is different from her past experience with rib fractures.  She has been using ibuprofen  and Aleve inconsistently for pain relief, with variable effectiveness. The pain is particularly bad in the morning and worsens by dinnertime, impacting her ability to function normally. She has also experienced episodes of feeling hot and flushed, particularly in the evenings, but these have improved recently.  She has a history of breaking a rib from coughing in her twenties and is concerned about a similar injury. No burning, frequency, or blood in her  urine. She previously experienced episodes of fever and feeling hot and flushed, particularly in the evenings, but these symptoms have improved recently.  She has a history of IBS and gallbladder removal, which causes her to experience diarrhea and heartburn. She is cautious about medications that might exacerbate these conditions.      Medications: Show/hide medication list[1]       Objective    BP (!) 123/90 (BP Location: Left Arm, Patient Position: Sitting, Cuff Size: Large)   Pulse 85   Temp 98.1 F (36.7 C) (Oral)   Ht 5' 9 (1.753 m)   Wt 280 lb 6.4 oz (127.2 kg)   SpO2 98%   BMI 41.41 kg/m     Physical Exam Vitals and nursing note reviewed.  Constitutional:      General: She is not in acute distress.    Appearance: Normal appearance.  HENT:     Head: Normocephalic and atraumatic.  Eyes:     General: No scleral icterus.    Conjunctiva/sclera: Conjunctivae normal.  Cardiovascular:     Rate and Rhythm: Normal rate.  Pulmonary:     Effort: Pulmonary effort is normal.  Neurological:     Mental Status: She is alert and oriented to person, place, and time. Mental status is at baseline.  Psychiatric:        Mood and Affect: Mood normal.        Behavior: Behavior normal.  No results found for any visits on 12/02/24.  Assessment & Plan    Acute upper back pain -     Cyclobenzaprine  HCl; Take 1 tablet (5 mg total) by mouth at bedtime as needed and may repeat dose one time if needed for muscle spasms.  Dispense: 30 tablet; Refill: 1 -     Celecoxib ; Take 1 capsule (200 mg total) by mouth 2 (two) times daily for 7 days.  Dispense: 14 capsule; Refill: 0   Pain persisting for four weeks, initially left-sided, now radiating. Likely muscular strain. Celecoxib  chosen for gastrointestinal safety due to IBS and cholecystectomy. - Discussed risks/benefits of x-ray; will defer imaging for the time being. - Prescribed celecoxib  for 7 days, option to stop after 5 days  if improved. - Prescribed cyclobenzaprine  for nighttime use, additional dose if needed. - Advised against ibuprofen /other NSAIDs with celecoxib . - Discussed NSAID side effects, advised food intake to mitigate. - Advised to monitor symptoms, discontinue if no improvement after 7 days.    Return if symptoms worsen or fail to improve.      I discussed the assessment and treatment plan with the patient  The patient was provided an opportunity to ask questions and all were answered. The patient agreed with the plan and demonstrated an understanding of the instructions.   The patient was advised to call back or seek an in-person evaluation if the symptoms worsen or if the condition fails to improve as anticipated.    LAURAINE LOISE BUOY, DO  [facility] [phone] (phone) [phone] (fax)  [facility]     [1]  Outpatient Medications Prior to Visit  Medication Sig   albuterol  (VENTOLIN  HFA) 108 (90 Base) MCG/ACT inhaler Inhale 2 puffs into the lungs every 6 (six) hours as needed for wheezing or shortness of breath.   diphenhydrAMINE  (BENADRYL ) 25 MG tablet Take 25 mg by mouth every evening.   sertraline (ZOLOFT) 50 MG tablet Take 50 mg by mouth daily.   [DISCONTINUED] predniSONE  (DELTASONE ) 20 MG tablet Take 1 tablet (20 mg total) by mouth 2 (two) times daily with a meal.   No facility-administered medications prior to visit.   "

## 2024-12-16 ENCOUNTER — Encounter: Payer: Self-pay | Admitting: Family Medicine

## 2025-04-17 ENCOUNTER — Other Ambulatory Visit

## 2025-04-17 ENCOUNTER — Ambulatory Visit: Admitting: Oncology
